# Patient Record
Sex: Female | Born: 1990 | Race: Black or African American | Hispanic: No | Marital: Single | Smoking: Never smoker
Health system: Southern US, Community
[De-identification: ages and names within clinical notes are randomized; demographics above are authoritative.]

## PROBLEM LIST (undated history)

## (undated) DIAGNOSIS — O43129 Velamentous insertion of umbilical cord, unspecified trimester: Secondary | ICD-10-CM

## (undated) DIAGNOSIS — Z789 Other specified health status: Secondary | ICD-10-CM

## (undated) DIAGNOSIS — I10 Essential (primary) hypertension: Secondary | ICD-10-CM

## (undated) HISTORY — DX: Velamentous insertion of umbilical cord, unspecified trimester: O43.129

## (undated) HISTORY — PX: NO PAST SURGERIES: SHX2092

---
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
## 2011-08-24 ENCOUNTER — Emergency Department (INDEPENDENT_AMBULATORY_CARE_PROVIDER_SITE_OTHER)
Admission: EM | Admit: 2011-08-24 | Discharge: 2011-08-24 | Disposition: A | Discharge status: Home / Self Care | Payer: Medicaid Other | Source: Home / Self Care

## 2011-08-24 ENCOUNTER — Encounter (HOSPITAL_COMMUNITY): Payer: Self-pay | Admitting: *Deleted

## 2011-08-24 DIAGNOSIS — J329 Chronic sinusitis, unspecified: Secondary | ICD-10-CM

## 2011-08-24 MED ORDER — NAPROXEN 500 MG PO TABS: 500.0000 mg | ORAL_TABLET | Freq: Two times a day (BID) | ORAL | Status: AC

## 2011-08-24 MED ORDER — BENZONATATE 100 MG PO CAPS: 100.0000 mg | ORAL_CAPSULE | Freq: Three times a day (TID) | ORAL | Status: AC

## 2011-08-24 MED ORDER — FLUTICASONE PROPIONATE 50 MCG/ACT NA SUSP: 2.0000 | Freq: Every day | NASAL | Status: DC

## 2011-08-24 NOTE — ED Provider Notes (Signed)
Medical screening examination/treatment/procedure(s) were performed by non-physician practitioner and as supervising physician I was immediately available for consultation/collaboration.  Corrie Mckusick, MD 08/24/11 2026

## 2011-08-24 NOTE — ED Provider Notes (Signed)
History     CSN: [id number]  Arrival date & time 08/24/11  0916  9:50 AM HPI Sarah Andersen is a 21 y.o. female complaining of a sinus infection. States for 4 days she has symptoms of nasal congestion, rhinorrhea, sinus headache, and a mild cough. Denies fever, neck pain, sore throat, chest pain, shortness of breath, abdominal pain, nausea, vomiting. Reports symptoms feel like prior sinus infections she has had in the past. No improvement after taking ibuprofen.  Patient is a 21 y.o. female presenting with sinusitis. The history is provided by the patient.  Sinusitis  This is a new problem. Episode onset: 4 days ago. The problem has been gradually worsening. There has been no fever. The pain is at a severity of 8/10. The pain is moderate. The pain has been constant since onset. Associated symptoms include congestion, sinus pressure, sore throat and cough (nonproductive). Pertinent negatives include no chills, no ear pain and no shortness of breath. Treatments tried: ibuprofen. The treatment provided no relief.    History reviewed. No pertinent past medical history.  History reviewed. No pertinent past surgical history.  History reviewed. No pertinent family history.  History  Substance Use Topics  . Smoking status: Never Smoker   . Smokeless tobacco: Not on file  . Alcohol Use: No    OB History    Grav Para Term Preterm Abortions TAB SAB Ect Mult Living                  Review of Systems  Constitutional: Negative for fever and chills.  HENT: Positive for congestion, sore throat, rhinorrhea, postnasal drip and sinus pressure. Negative for ear pain and neck pain.   Respiratory: Positive for cough (nonproductive). Negative for shortness of breath.   Cardiovascular: Negative for chest pain.  Gastrointestinal: Negative for nausea, vomiting and abdominal pain.  Skin: Negative for rash.  Neurological: Positive for headaches. Negative for dizziness, light-headedness and numbness.  All  other systems reviewed and are negative.    Allergies  Review of patient's allergies indicates no known allergies.  Home Medications   Current Outpatient Rx  Name Route Sig Dispense Refill  . DIPHENHYDRAMINE HCL 25 MG PO CAPS Oral Take 25 mg by mouth every 6 (six) hours as needed.    . IBUPROFEN 200 MG PO TABS Oral Take 200 mg by mouth every 6 (six) hours as needed.      BP 127/82  Pulse 107  Temp(Src) 98.9 F (37.2 C) (Oral)  Resp 20  SpO2 98%  Physical Exam  Vitals reviewed. Constitutional: She is oriented to person, place, and time. Vital signs are normal. She appears well-developed and well-nourished.  HENT:  Head: Normocephalic and atraumatic.  Right Ear: Tympanic membrane, external ear and ear canal normal.  Left Ear: Tympanic membrane, external ear and ear canal normal.  Nose: Rhinorrhea present. Right sinus exhibits maxillary sinus tenderness and frontal sinus tenderness. Left sinus exhibits maxillary sinus tenderness and frontal sinus tenderness.  Mouth/Throat: Uvula is midline, oropharynx is clear and moist and mucous membranes are normal. No uvula swelling. No oropharyngeal exudate, posterior oropharyngeal erythema or tonsillar abscesses.  Eyes: Conjunctivae are normal. Pupils are equal, round, and reactive to light.  Neck: Normal range of motion. Neck supple.  Cardiovascular: Normal rate, regular rhythm and normal heart sounds.  Exam reveals no friction rub.   No murmur heard. Pulmonary/Chest: Effort normal and breath sounds normal. She has no wheezes. She has no rhonchi. She has no rales. She exhibits no  tenderness.  Musculoskeletal: Normal range of motion.  Lymphadenopathy:    She has no cervical adenopathy.  Neurological: She is alert and oriented to person, place, and time. Coordination normal.  Skin: Skin is warm and dry. No rash noted. No erythema. No pallor.    ED Course  Procedures   MDM          Thomasene Lot, PA-C 08/24/11 4782

## 2011-08-24 NOTE — Discharge Instructions (Signed)
How to clear your sinuses:  Use Afrin (and over-the-counter nasal decongestant spray): 1-2 squirts in each nostril. Wait 10 minutes (while using either warm compresses to face, a humidifier, or taking a hot shower) Then do 2 squirts of fluticasone (prescription)  in each nostril. Do this for 1-2 weeks and that should help significantly with sinus congestion. Take Naproxen for pain and tessalon for cough.  Sinusitis Sinuses are air pockets within the bones of your face. The growth of bacteria within a sinus leads to infection. The infection prevents the sinuses from draining. This infection is called sinusitis. SYMPTOMS  There will be different areas of pain depending on which sinuses have become infected.  The maxillary sinuses often produce pain beneath the eyes.   Frontal sinusitis may cause pain in the middle of the forehead and above the eyes.  Other problems (symptoms) include:  Toothaches.   Colored, pus-like (purulent) drainage from the nose.   Swelling, warmth, and tenderness over the sinus areas may be signs of infection.  TREATMENT  Sinusitis is most often determined by an exam.X-rays may be taken. If x-rays have been taken, make sure you obtain your results or find out how you are to obtain them. Your caregiver may give you medications (antibiotics). These are medications that will help kill the bacteria causing the infection. You may also be given a medication (decongestant) that helps to reduce sinus swelling.  HOME CARE INSTRUCTIONS   Only take over-the-counter or prescription medicines for pain, discomfort, or fever as directed by your caregiver.   Drink extra fluids. Fluids help thin the mucus so your sinuses can drain more easily.   Applying either moist heat or ice packs to the sinus areas may help relieve discomfort.   Use saline nasal sprays to help moisten your sinuses. The sprays can be found at your local drugstore.  SEEK IMMEDIATE MEDICAL CARE IF:  You have a  fever.   You have increasing pain, severe headaches, or toothache.   You have nausea, vomiting, or drowsiness.   You develop unusual swelling around the face or trouble seeing.  MAKE SURE YOU:   Understand these instructions.   Will watch your condition.   Will get help right away if you are not doing well or get worse.  Document Released: 06/23/2005 Document Revised: 03/05/2011 Document Reviewed: 01/20/2007 [facility] Patient Information 2012 [place], [place].

## 2011-08-24 NOTE — ED Notes (Signed)
Pt with c/o cough/congestion/headache onset of symptoms Thursday - denies fever

## 2012-07-07 NOTE — L&D Delivery Note (Addendum)
Delivery Note At 3:42 AM a viable and healthy female was delivered spontaneously OA.  APGAR: 9, 9; weight 5 lbs 8oz.   Placenta status: Intact, Spontaneous.  Cord:  3 vessels, loose, single nuchal loop.  Anesthesia: Epidural  Episiotomy: None Lacerations: 1st degree right labia minora.  Repaired. Suture Repair: 4-0 chromic Est. Blood Loss (mL): 350  Mom and baby stable with skin to skin.  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  Arden Axon D 01/26/2013, 4:04 AM

## 2012-09-06 LAB — OB RESULTS CONSOLE HIV ANTIBODY (ROUTINE TESTING): HIV: NONREACTIVE

## 2012-09-06 LAB — OB RESULTS CONSOLE ABO/RH

## 2012-09-06 LAB — OB RESULTS CONSOLE ANTIBODY SCREEN: Antibody Screen: NEGATIVE

## 2012-09-06 LAB — OB RESULTS CONSOLE GC/CHLAMYDIA: Gonorrhea: NEGATIVE

## 2012-09-29 ENCOUNTER — Other Ambulatory Visit (HOSPITAL_COMMUNITY): Payer: Self-pay | Admitting: Obstetrics & Gynecology

## 2012-09-29 ENCOUNTER — Other Ambulatory Visit: Payer: Self-pay

## 2012-09-29 DIAGNOSIS — O283 Abnormal ultrasonic finding on antenatal screening of mother: Secondary | ICD-10-CM

## 2012-10-05 ENCOUNTER — Ambulatory Visit (HOSPITAL_COMMUNITY): Admission: RE | Admit: 2012-10-05 | Payer: 59 | Source: Ambulatory Visit

## 2012-10-05 ENCOUNTER — Other Ambulatory Visit (HOSPITAL_COMMUNITY): Payer: Self-pay | Admitting: Obstetrics & Gynecology

## 2012-10-05 ENCOUNTER — Encounter (HOSPITAL_COMMUNITY): Payer: Self-pay

## 2012-10-05 ENCOUNTER — Ambulatory Visit (HOSPITAL_COMMUNITY)
Admission: RE | Admit: 2012-10-05 | Discharge: 2012-10-05 | Disposition: A | Discharge status: Home / Self Care | Payer: 59 | Source: Ambulatory Visit | Attending: Obstetrics & Gynecology | Admitting: Obstetrics & Gynecology

## 2012-10-05 VITALS — BP 101/65 | HR 85 | Wt 125.5 lb

## 2012-10-05 DIAGNOSIS — O358XX0 Maternal care for other (suspected) fetal abnormality and damage, not applicable or unspecified: Secondary | ICD-10-CM | POA: Insufficient documentation

## 2012-10-05 DIAGNOSIS — O283 Abnormal ultrasonic finding on antenatal screening of mother: Secondary | ICD-10-CM

## 2012-10-05 DIAGNOSIS — O352XX0 Maternal care for (suspected) hereditary disease in fetus, not applicable or unspecified: Secondary | ICD-10-CM | POA: Insufficient documentation

## 2012-10-05 DIAGNOSIS — IMO0002 Reserved for concepts with insufficient information to code with codable children: Secondary | ICD-10-CM

## 2012-10-05 DIAGNOSIS — Z363 Encounter for antenatal screening for malformations: Secondary | ICD-10-CM | POA: Insufficient documentation

## 2012-10-05 DIAGNOSIS — Z1389 Encounter for screening for other disorder: Secondary | ICD-10-CM | POA: Insufficient documentation

## 2012-10-05 NOTE — Progress Notes (Signed)
[facility] ultrasound  Indication: 22 yr old G1P0 at [redacted]w[redacted]d for fetal anatomic survey; finding of right leg abnormality on outside ultrasound.  Findings: 1. Single intrauterine pregnancy. 2. Fetal biometry is consistent with dating. 3. Anterior placenta without evidence of previa. 4. Normal amniotic fluid volume. 5. Normal transvaginal cervical length. 6. The right lower leg is abnormal. There is a normal femur; the lower leg- there is no evidence of a normal tibia and fibula; there appears to be an appendage coming off of the knee. No normal lower leg or foot is seen. 7. The left leg appears normal; the left foot appears to be slightly misshapen in certain views. 8. The remainder of the anatomic survey is normal.  Recommendations: 1. Appropriate fetal growth. 2. Fetal anatomic survey is complete. 3. Right leg anomaly: - see consult letter - referred to Pediatric Orthopedic Surgery 4. Had normal quad screen - declined amniocentesis 5. Recommend follow up in 4 weeks  Sarah Foster, MD

## 2012-10-05 NOTE — Consult Note (Signed)
MFM consult  22 yr old G1P0 at [redacted]w[redacted]d referred by Dr. Arlyce Dice for fetal anatomic survey and consult secondary to finding of right leg abnormality on outside ultrasound.  Past OB hx: none PMH: none Medications: PNV  Patient denies other medication or teratogen exposure in the first trimester  Ultrasound today shows: fetal biometry is consistent with dating. Anterior placenta without evidence of previa. Normal amniotic fluid volume. Normal transvaginal cervical length. The right lower leg is abnormal. There is a normal femur; the lower leg- there is no evidence of a normal tibia and fibula; there appears to be an appendage coming off of the knee. No normal lower leg or foot is seen. The left leg appears normal; the left foot appears to be slightly misshapen in certain views.  There is an echogenic focus in the left ventricle. The remainder of the anatomic survey is normal.  I counseled the patient as follows: 1. Appropriate fetal growth. 2. Fetal anatomic survey is complete. 3. 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; however other possible etiologies are: amniotic band syndrome, teratogen exposure, genetic condition - discussed overall prognosis is good if this anomaly is not in the setting of a genetic condition - discussed the likely course of treatment would be amputation of the lower appendage and use of prosthetic leg - discussed there are many genetic conditions that have limb anomalies; there are no other findings on the ultrasound (with the exception of the echogenic focus) to suggest a genetic condition; discussed the infant will be examined after delivery and if there are any other physical findings suggestive of a genetic condition a work up will be done at that time - I have referred the patient to Pediatric Orthopedic Surgery for prenatal consultation 4. Had normal quad screen - declined amniocentesis 5. Echogenic  focus in the left ventricle: - discussed in low risk patients this is likely a benign variant - discussed when found in association with other findings the risk of trisomy 21 may be increased; although discussed limb abnormalities are not usually associated with trisomy 63 - patient had quad screen which showed risk of trisomy 21 of <1:5,000; risk of trisomy 18 of <1:5,000 and screen negative for neural tube defects; discussed limitations of screening tests in detecting fetal aneuploidy - offered amniocentesis; patient declined 6. Recommend follow up in 4 weeks  I spent 30 minutes in face to face consultation with the patient in addition to time spent on the ultrasound.  Eulis Foster, MD

## 2012-11-02 ENCOUNTER — Ambulatory Visit (HOSPITAL_COMMUNITY)
Admission: RE | Admit: 2012-11-02 | Discharge: 2012-11-02 | Disposition: A | Discharge status: Home / Self Care | Payer: 59 | Source: Ambulatory Visit | Attending: Obstetrics & Gynecology | Admitting: Obstetrics & Gynecology

## 2012-11-02 VITALS — BP 114/56 | HR 85 | Wt 131.8 lb

## 2012-11-02 DIAGNOSIS — O352XX0 Maternal care for (suspected) hereditary disease in fetus, not applicable or unspecified: Secondary | ICD-10-CM | POA: Insufficient documentation

## 2012-11-02 DIAGNOSIS — IMO0002 Reserved for concepts with insufficient information to code with codable children: Secondary | ICD-10-CM

## 2012-11-02 DIAGNOSIS — O358XX0 Maternal care for other (suspected) fetal abnormality and damage, not applicable or unspecified: Secondary | ICD-10-CM | POA: Insufficient documentation

## 2012-12-10 ENCOUNTER — Encounter: Payer: Self-pay | Admitting: Cardiovascular Disease

## 2012-12-14 ENCOUNTER — Encounter (HOSPITAL_COMMUNITY): Payer: Self-pay

## 2012-12-14 ENCOUNTER — Ambulatory Visit (HOSPITAL_COMMUNITY)
Admission: RE | Admit: 2012-12-14 | Discharge: 2012-12-14 | Disposition: A | Discharge status: Home / Self Care | Payer: 59 | Source: Ambulatory Visit | Attending: Obstetrics & Gynecology | Admitting: Obstetrics & Gynecology

## 2012-12-14 VITALS — BP 113/69 | HR 97 | Wt 132.5 lb

## 2012-12-14 DIAGNOSIS — O358XX0 Maternal care for other (suspected) fetal abnormality and damage, not applicable or unspecified: Secondary | ICD-10-CM | POA: Insufficient documentation

## 2012-12-14 DIAGNOSIS — IMO0002 Reserved for concepts with insufficient information to code with codable children: Secondary | ICD-10-CM

## 2012-12-14 DIAGNOSIS — Z3689 Encounter for other specified antenatal screening: Secondary | ICD-10-CM | POA: Insufficient documentation

## 2012-12-14 NOTE — Progress Notes (Signed)
Sarah Andersen  was seen today for an ultrasound appointment.  See full report in [facility].  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 Ortho evaluation after delivery  Alpha Gula, MD

## 2013-01-12 ENCOUNTER — Ambulatory Visit (HOSPITAL_COMMUNITY)
Admission: RE | Admit: 2013-01-12 | Discharge: 2013-01-12 | Disposition: A | Discharge status: Home / Self Care | Payer: 59 | Source: Ambulatory Visit | Attending: Obstetrics & Gynecology | Admitting: Obstetrics & Gynecology

## 2013-01-12 DIAGNOSIS — O358XX0 Maternal care for other (suspected) fetal abnormality and damage, not applicable or unspecified: Secondary | ICD-10-CM | POA: Insufficient documentation

## 2013-01-12 DIAGNOSIS — Z3689 Encounter for other specified antenatal screening: Secondary | ICD-10-CM | POA: Insufficient documentation

## 2013-01-12 DIAGNOSIS — IMO0002 Reserved for concepts with insufficient information to code with codable children: Secondary | ICD-10-CM

## 2013-01-12 NOTE — Progress Notes (Signed)
[facility]  Indication: 22 yr old G1P0 at [redacted]w[redacted]d with fetal leg abnormality for follow up fetal growth.  Findings: 1. Single intrauterine pregnancy. 2. Estimated featl weight is in the 29th%. 3. Anterior placenta without evidence of previa. 4. Normal amniotic fluid index. 5. Again the right lower leg is abnormal although visualization is suboptimal at this advanced gestatational age. 6. The remainder of the limited anatomic survey is normal.  Recommendations: 1. Appropriate fetal growth. 2.  Right leg anomaly: - previously counseled - has met with Pediatric Orthopedic Surgery 3. Had normal quad screen - declined amniocentesis 4. Recommend inform Pediatrics at delivery - plan is for follow up with Pediatric Orthopedics after delivery 5. Recommend follow up ultrasounds as clinically indicated   Sarah Foster, MD

## 2013-01-25 ENCOUNTER — Inpatient Hospital Stay (HOSPITAL_COMMUNITY)
Admission: AD | Admit: 2013-01-25 | Discharge: 2013-01-28 | DRG: 774 | Disposition: A | Discharge status: Home / Self Care | Payer: 59 | Source: Ambulatory Visit | Attending: Obstetrics & Gynecology | Admitting: Obstetrics & Gynecology

## 2013-01-25 ENCOUNTER — Encounter (HOSPITAL_COMMUNITY): Payer: Self-pay | Admitting: *Deleted

## 2013-01-25 DIAGNOSIS — O1414 Severe pre-eclampsia complicating childbirth: Principal | ICD-10-CM | POA: Diagnosis present

## 2013-01-25 DIAGNOSIS — O358XX0 Maternal care for other (suspected) fetal abnormality and damage, not applicable or unspecified: Secondary | ICD-10-CM | POA: Diagnosis present

## 2013-01-25 HISTORY — DX: Other specified health status: Z78.9

## 2013-01-25 LAB — URINE MICROSCOPIC-ADD ON

## 2013-01-25 LAB — URINALYSIS, ROUTINE W REFLEX MICROSCOPIC
Glucose, UA: NEGATIVE mg/dL
Ketones, ur: NEGATIVE mg/dL
Nitrite: NEGATIVE
Protein, ur: NEGATIVE mg/dL
pH: 6.5 (ref 5.0–8.0)

## 2013-01-25 LAB — OB RESULTS CONSOLE GBS: GBS: NEGATIVE

## 2013-01-25 LAB — CBC
HCT: 34.1 % — ABNORMAL LOW (ref 36.0–46.0)
MCHC: 35.2 g/dL (ref 30.0–36.0)
RDW: 13.1 % (ref 11.5–15.5)
WBC: 8.3 10*3/uL (ref 4.0–10.5)

## 2013-01-25 LAB — COMPREHENSIVE METABOLIC PANEL
ALT: 10 U/L (ref 0–35)
AST: 30 U/L (ref 0–37)
Albumin: 2.8 g/dL — ABNORMAL LOW (ref 3.5–5.2)
Alkaline Phosphatase: 188 U/L — ABNORMAL HIGH (ref 39–117)
BUN: 4 mg/dL — ABNORMAL LOW (ref 6–23)
Chloride: 104 mEq/L (ref 96–112)
Potassium: 3.3 mEq/L — ABNORMAL LOW (ref 3.5–5.1)
Sodium: 137 mEq/L (ref 135–145)
Total Bilirubin: 0.3 mg/dL (ref 0.3–1.2)
Total Protein: 6.5 g/dL (ref 6.0–8.3)

## 2013-01-25 LAB — URIC ACID: Uric Acid, Serum: 5.6 mg/dL (ref 2.4–7.0)

## 2013-01-25 MED ORDER — PHENYLEPHRINE 40 MCG/ML (10ML) SYRINGE FOR IV PUSH (FOR BLOOD PRESSURE SUPPORT)
80.0000 ug | PREFILLED_SYRINGE | INTRAVENOUS | Status: DC | PRN
  Filled 2013-01-25: qty 2

## 2013-01-25 MED ORDER — MAGNESIUM SULFATE 40 G IN LACTATED RINGERS - SIMPLE
2.0000 g/h | INTRAVENOUS | Status: DC
  Administered 2013-01-25: 2 g/h via INTRAVENOUS
  Filled 2013-01-25: qty 500

## 2013-01-25 MED ORDER — LACTATED RINGERS IV SOLN
INTRAVENOUS | Status: DC
  Administered 2013-01-25: 23:00:00 via INTRAVENOUS

## 2013-01-25 MED ORDER — BUTORPHANOL TARTRATE 1 MG/ML IJ SOLN: 1.0000 mg | INTRAMUSCULAR | Status: DC | PRN

## 2013-01-25 MED ORDER — LABETALOL HCL 5 MG/ML IV SOLN
20.0000 mg | INTRAVENOUS | Status: DC | PRN
  Administered 2013-01-25 – 2013-01-26 (×2): 20 mg via INTRAVENOUS
  Filled 2013-01-25 (×2): qty 4

## 2013-01-25 MED ORDER — ONDANSETRON HCL 4 MG/2ML IJ SOLN: 4.0000 mg | Freq: Four times a day (QID) | INTRAMUSCULAR | Status: DC | PRN

## 2013-01-25 MED ORDER — PHENYLEPHRINE 40 MCG/ML (10ML) SYRINGE FOR IV PUSH (FOR BLOOD PRESSURE SUPPORT)
80.0000 ug | PREFILLED_SYRINGE | INTRAVENOUS | Status: DC | PRN
  Filled 2013-01-25: qty 2
  Filled 2013-01-25: qty 5

## 2013-01-25 MED ORDER — DIPHENHYDRAMINE HCL 50 MG/ML IJ SOLN: 12.5000 mg | INTRAMUSCULAR | Status: DC | PRN

## 2013-01-25 MED ORDER — MAGNESIUM SULFATE BOLUS VIA INFUSION
4.0000 g | Freq: Once | INTRAVENOUS | Status: AC
  Administered 2013-01-25: 4 g via INTRAVENOUS
  Filled 2013-01-25: qty 500

## 2013-01-25 MED ORDER — OXYTOCIN BOLUS FROM INFUSION: 500.0000 mL | INTRAVENOUS | Status: DC

## 2013-01-25 MED ORDER — IBUPROFEN 600 MG PO TABS: 600.0000 mg | ORAL_TABLET | Freq: Four times a day (QID) | ORAL | Status: DC | PRN

## 2013-01-25 MED ORDER — OXYTOCIN 40 UNITS IN LACTATED RINGERS INFUSION - SIMPLE MED
62.5000 mL/h | INTRAVENOUS | Status: DC
  Administered 2013-01-26: 62.5 mL/h via INTRAVENOUS

## 2013-01-25 MED ORDER — FENTANYL 2.5 MCG/ML BUPIVACAINE 1/10 % EPIDURAL INFUSION (WH - ANES)
14.0000 mL/h | INTRAMUSCULAR | Status: DC | PRN
  Administered 2013-01-26: 14 mL/h via EPIDURAL
  Filled 2013-01-25: qty 125

## 2013-01-25 MED ORDER — LACTATED RINGERS IV SOLN: 500.0000 mL | INTRAVENOUS | Status: DC | PRN

## 2013-01-25 MED ORDER — FLEET ENEMA 7-19 GM/118ML RE ENEM: 1.0000 | ENEMA | RECTAL | Status: DC | PRN

## 2013-01-25 MED ORDER — ACETAMINOPHEN 325 MG PO TABS: 650.0000 mg | ORAL_TABLET | ORAL | Status: DC | PRN

## 2013-01-25 MED ORDER — LIDOCAINE HCL (PF) 1 % IJ SOLN
30.0000 mL | INTRAMUSCULAR | Status: DC | PRN
  Filled 2013-01-25 (×2): qty 30

## 2013-01-25 MED ORDER — CITRIC ACID-SODIUM CITRATE 334-500 MG/5ML PO SOLN: 30.0000 mL | ORAL | Status: DC | PRN

## 2013-01-25 MED ORDER — OXYCODONE-ACETAMINOPHEN 5-325 MG PO TABS: 1.0000 | ORAL_TABLET | ORAL | Status: DC | PRN

## 2013-01-25 MED ORDER — EPHEDRINE 5 MG/ML INJ
10.0000 mg | INTRAVENOUS | Status: DC | PRN
  Filled 2013-01-25: qty 2

## 2013-01-25 MED ORDER — EPHEDRINE 5 MG/ML INJ
10.0000 mg | INTRAVENOUS | Status: DC | PRN
  Filled 2013-01-25: qty 4
  Filled 2013-01-25: qty 2

## 2013-01-25 MED ORDER — LACTATED RINGERS IV SOLN
500.0000 mL | Freq: Once | INTRAVENOUS | Status: AC
  Administered 2013-01-26: 500 mL via INTRAVENOUS

## 2013-01-25 NOTE — MAU Provider Note (Signed)
Chief Complaint:  Labor Eval  First Provider Initiated Contact with Patient 01/25/13 2148     HPI: Sarah Andersen is a 22 y.o. G1P0000 at [redacted]w[redacted]d who presents to maternity admissions reporting strong contractions since 1700. Had aapoipntment today. States she was dilated 4/70 and had normal BP   Pregnancy Course: Followed by [facility] for RLE anomaly and LVEIF.  Past Medical History: Past Medical History  Diagnosis Date  . Medical history non-contributory     Past obstetric history: OB History   Grav Para Term Preterm Abortions TAB SAB Ect Mult Living   1 0 0 0 0 0 0 0 0 0      # Outc Date GA Lbr Len/2nd Wgt Sex Del Anes PTL Lv   1 CUR               Past Surgical History: Past Surgical History  Procedure Laterality Date  . No past surgeries      Family History: Non-contributory  Social History: History  Substance Use Topics  . Smoking status: Never Smoker   . Smokeless tobacco: Not on file  . Alcohol Use: No    Allergies: No Known Allergies  Meds:  Prescriptions prior to admission  Medication Sig Dispense Refill  . Prenatal Vit-Fe Fumarate-FA (PRENATAL MULTIVITAMIN) TABS Take 1 tablet by mouth daily at 12 noon.      . fluticasone (FLONASE) 50 MCG/ACT nasal spray Place 2 sprays into the nose daily.  16 g  2  . [DISCONTINUED] diphenhydrAMINE (BENADRYL) 25 mg capsule Take 25 mg by mouth every 6 (six) hours as needed.      . [DISCONTINUED] ibuprofen (ADVIL,MOTRIN) 200 MG tablet Take 200 mg by mouth every 6 (six) hours as needed.        ROS: Pos for generalized HA x 2 days, 6/10 (no Hx similar HA's). Neg for vision changes, epigastric pain, leakage of fluid or vaginal bleeding. Good fetal movement.    Physical Exam  Blood pressure 173/109, pulse 65, temperature 98.5 F (36.9 C), temperature source Oral, resp. rate 18, height 5\' 6"  (1.676 m), weight 66.225 kg (146 lb), last menstrual period 05/13/2012. Patient Vitals for the past 24 hrs:  BP Temp Temp src Pulse Resp Height  Weight  01/25/13 2212 161/111 mmHg - - 67 - - -  01/25/13 2204 153/126 mmHg - - 71 - - -  01/25/13 2203 155/108 mmHg - - 79 - - -  01/25/13 2132 159/108 mmHg - - 71 - - -  01/25/13 2126 173/109 mmHg - - - - - -  01/25/13 2111 155/113 mmHg - - - - - -  01/25/13 2108 169/110 mmHg - - - - - -  01/25/13 2107 167/111 mmHg - - - - - -  01/25/13 2106 169/110 mmHg 98.5 F (36.9 C) Oral 65 18 5\' 6"  (1.676 m) 66.225 kg (146 lb)    GENERAL: Well-developed, well-nourished female in mild-moderate distress.  HEENT: normocephalic HEART: normal rate RESP: normal effort ABDOMEN: Soft, non-tender, gravid appropriate for gestational age EXTREMITIES: Nontender, no edema NEURO: alert and oriented. DTR's 2+, no clonus. PELVIC EXAM: NEFG, no blood Dilation: 4 Effacement (%): 90 Station: -2 Vertex Exam by:: Ivonne Andrew CNM  FHT:  Baseline 130 , moderate variability, accelerations present, no decelerations Contractions: q 4-5 mins, mild-moderate   Labs: Results for orders placed during the hospital encounter of 01/25/13 (from the past 24 hour(s))  CBC     Status: Abnormal   Collection Time    01/25/13  9:40 PM      Result Value Range   WBC 8.3  4.0 - 10.5 K/uL   RBC 4.01  3.87 - 5.11 MIL/uL   Hemoglobin 12.0  12.0 - 15.0 g/dL   HCT 64.3 (*) 32.9 - 51.8 %   MCV 85.0  78.0 - 100.0 fL   MCH 29.9  26.0 - 34.0 pg   MCHC 35.2  30.0 - 36.0 g/dL   RDW 84.1  66.0 - 63.0 %   Platelets 181  150 - 400 K/uL  COMPREHENSIVE METABOLIC PANEL     Status: Abnormal   Collection Time    01/25/13  9:40 PM      Result Value Range   Sodium 137  135 - 145 mEq/L   Potassium 3.3 (*) 3.5 - 5.1 mEq/L   Chloride 104  96 - 112 mEq/L   CO2 23  19 - 32 mEq/L   Glucose, Bld 83  70 - 99 mg/dL   BUN 4 (*) 6 - 23 mg/dL   Creatinine, Ser 1.60  0.50 - 1.10 mg/dL   Calcium 9.3  8.4 - 10.9 mg/dL   Total Protein 6.5  6.0 - 8.3 g/dL   Albumin 2.8 (*) 3.5 - 5.2 g/dL   AST 30  0 - 37 U/L   ALT 10  0 - 35 U/L   Alkaline  Phosphatase 188 (*) 39 - 117 U/L   Total Bilirubin 0.3  0.3 - 1.2 mg/dL   GFR calc non Af Amer >90  >90 mL/min   GFR calc Af Amer >90  >90 mL/min  URIC ACID     Status: None   Collection Time    01/25/13  9:40 PM      Result Value Range   Uric Acid, Serum 5.6  2.4 - 7.0 mg/dL  LACTATE DEHYDROGENASE     Status: None   Collection Time    01/25/13  9:40 PM      Result Value Range   LDH 227  94 - 250 U/L  URINALYSIS, ROUTINE W REFLEX MICROSCOPIC     Status: Abnormal   Collection Time    01/25/13 10:00 PM      Result Value Range   Color, Urine YELLOW  YELLOW   APPearance CLEAR  CLEAR   Specific Gravity, Urine <1.005 (*) 1.005 - 1.030   pH 6.5  5.0 - 8.0   Glucose, UA NEGATIVE  NEGATIVE mg/dL   Hgb urine dipstick MODERATE (*) NEGATIVE   Bilirubin Urine NEGATIVE  NEGATIVE   Ketones, ur NEGATIVE  NEGATIVE mg/dL   Protein, ur NEGATIVE  NEGATIVE mg/dL   Urobilinogen, UA 0.2  0.0 - 1.0 mg/dL   Nitrite NEGATIVE  NEGATIVE   Leukocytes, UA LARGE (*) NEGATIVE  URINE MICROSCOPIC-ADD ON     Status: None   Collection Time    01/25/13 10:00 PM      Result Value Range   Squamous Epithelial / LPF RARE  RARE   WBC, UA 3-6  <3 WBC/hpf   RBC / HPF 0-2  <3 RBC/hpf   Bacteria, UA RARE  RARE   Imaging:   MAU Course: Dr. Arlyce Dice notified of elevated BPs, HA, contractions, VE. PIH labs and Magnesium Sulfate and Labetalol IV ordered.  Assessment: Severe Pre-clampsia Prodromal vs early labor  Plan: Admit to L&D per consult w/ Dr. Arlyce Dice.  Dr. Arlyce Dice to assume care of pt.   [place], [place] 01/25/2013 9:48 PM

## 2013-01-25 NOTE — H&P (Signed)
22 y.o. G1P0  Estimated Date of Delivery: 02/17/13 admitted at 36/[redacted] weeks gestation with preeclampsia.  Patient was seen in the office this afternoon with BP 104/64 and negative urine protein.  Presented to MAU this evening for labor check and her BPs have been 170/110 range.  She complains of headache.  She otherwise appears calm and the elevated BPs do not appear to be due to pain related anxiety.  Urine protein and PIH labs are pending.  Prenatal Transfer Tool  Maternal Diabetes: No Genetic Screening: Normal QUAD screen at 16 weeks. Maternal Ultrasounds/Referrals: Abnormal:  Findings:   Other: Absent right leg.  No other anomalies noted. Fetal Ultrasounds or other Referrals:  Referred to Materal Fetal Medicine  Maternal Substance Abuse:  No Significant Maternal Medications:  None Significant Maternal Lab Results: None Other Significant Pregnancy Complications:  Severe Preeclampsia  Afebrile, VSS Heart and Lungs: No active disease Abdomen: soft, gravid, EFW AGA. Cervical exam:  4/90, -2  Impression:  Possible early labor (Cervix is unchanged from office exam) and preeclampsia with severe features.  Plan:  IV Magnesium prophylaxis, Labetalol 20 mg IV now; admit to L&D for delivery and will induce if she is not in labor.

## 2013-01-25 NOTE — MAU Note (Signed)
C/o ucs since about 1700 after her doctor's appointment;

## 2013-01-26 ENCOUNTER — Inpatient Hospital Stay (HOSPITAL_COMMUNITY): Payer: 59 | Admitting: Anesthesiology

## 2013-01-26 ENCOUNTER — Encounter (HOSPITAL_COMMUNITY): Payer: Self-pay | Admitting: Anesthesiology

## 2013-01-26 ENCOUNTER — Encounter (HOSPITAL_COMMUNITY): Payer: Self-pay | Admitting: *Deleted

## 2013-01-26 LAB — CBC
Hemoglobin: 11 g/dL — ABNORMAL LOW (ref 12.0–15.0)
MCHC: 35.1 g/dL (ref 30.0–36.0)
RDW: 13.1 % (ref 11.5–15.5)
WBC: 10.5 10*3/uL (ref 4.0–10.5)

## 2013-01-26 LAB — TYPE AND SCREEN
ABO/RH(D): O POS
Antibody Screen: NEGATIVE

## 2013-01-26 LAB — PROTEIN / CREATININE RATIO, URINE
Creatinine, Urine: 19.52 mg/dL
Total Protein, Urine: 3.2 mg/dL

## 2013-01-26 MED ORDER — LIDOCAINE HCL (PF) 1 % IJ SOLN
INTRAMUSCULAR | Status: DC | PRN
  Administered 2013-01-26 (×4): 4 mL

## 2013-01-26 MED ORDER — TETANUS-DIPHTH-ACELL PERTUSSIS 5-2.5-18.5 LF-MCG/0.5 IM SUSP
0.5000 mL | Freq: Once | INTRAMUSCULAR | Status: AC
  Administered 2013-01-27: 0.5 mL via INTRAMUSCULAR
  Filled 2013-01-26 (×2): qty 0.5

## 2013-01-26 MED ORDER — WITCH HAZEL-GLYCERIN EX PADS: 1.0000 "application " | MEDICATED_PAD | CUTANEOUS | Status: DC | PRN

## 2013-01-26 MED ORDER — OXYTOCIN 40 UNITS IN LACTATED RINGERS INFUSION - SIMPLE MED
1.0000 m[IU]/min | INTRAVENOUS | Status: DC
  Filled 2013-01-26: qty 1000

## 2013-01-26 MED ORDER — ONDANSETRON HCL 4 MG/2ML IJ SOLN: 4.0000 mg | INTRAMUSCULAR | Status: DC | PRN

## 2013-01-26 MED ORDER — LANOLIN HYDROUS EX OINT: TOPICAL_OINTMENT | CUTANEOUS | Status: DC | PRN

## 2013-01-26 MED ORDER — SENNOSIDES-DOCUSATE SODIUM 8.6-50 MG PO TABS: 2.0000 | ORAL_TABLET | Freq: Every day | ORAL | Status: DC

## 2013-01-26 MED ORDER — SENNOSIDES-DOCUSATE SODIUM 8.6-50 MG PO TABS
2.0000 | ORAL_TABLET | Freq: Every day | ORAL | Status: DC
  Administered 2013-01-26 – 2013-01-27 (×2): 2 via ORAL

## 2013-01-26 MED ORDER — SIMETHICONE 80 MG PO CHEW: 80.0000 mg | CHEWABLE_TABLET | ORAL | Status: DC | PRN

## 2013-01-26 MED ORDER — MAGNESIUM SULFATE 40 G IN LACTATED RINGERS - SIMPLE
2.0000 g/h | INTRAVENOUS | Status: DC
  Administered 2013-01-26: 2 g/h via INTRAVENOUS
  Filled 2013-01-26 (×2): qty 500

## 2013-01-26 MED ORDER — TERBUTALINE SULFATE 1 MG/ML IJ SOLN: 0.2500 mg | Freq: Once | INTRAMUSCULAR | Status: DC | PRN

## 2013-01-26 MED ORDER — ZOLPIDEM TARTRATE 5 MG PO TABS: 5.0000 mg | ORAL_TABLET | Freq: Every evening | ORAL | Status: DC | PRN

## 2013-01-26 MED ORDER — OXYCODONE-ACETAMINOPHEN 5-325 MG PO TABS: 1.0000 | ORAL_TABLET | ORAL | Status: DC | PRN

## 2013-01-26 MED ORDER — ONDANSETRON HCL 4 MG PO TABS: 4.0000 mg | ORAL_TABLET | ORAL | Status: DC | PRN

## 2013-01-26 MED ORDER — DIPHENHYDRAMINE HCL 25 MG PO CAPS: 25.0000 mg | ORAL_CAPSULE | Freq: Four times a day (QID) | ORAL | Status: DC | PRN

## 2013-01-26 MED ORDER — PRENATAL MULTIVITAMIN CH: 1.0000 | ORAL_TABLET | Freq: Every day | ORAL | Status: DC

## 2013-01-26 MED ORDER — LACTATED RINGERS IV SOLN
INTRAVENOUS | Status: DC
  Administered 2013-01-26 (×2): via INTRAVENOUS

## 2013-01-26 MED ORDER — DIBUCAINE 1 % RE OINT: 1.0000 "application " | TOPICAL_OINTMENT | RECTAL | Status: DC | PRN

## 2013-01-26 MED ORDER — PRENATAL MULTIVITAMIN CH
1.0000 | ORAL_TABLET | Freq: Every day | ORAL | Status: DC
  Administered 2013-01-26 – 2013-01-28 (×3): 1 via ORAL
  Filled 2013-01-26 (×3): qty 1

## 2013-01-26 MED ORDER — BENZOCAINE-MENTHOL 20-0.5 % EX AERO: 1.0000 "application " | INHALATION_SPRAY | CUTANEOUS | Status: DC | PRN

## 2013-01-26 MED ORDER — LABETALOL HCL 5 MG/ML IV SOLN
20.0000 mg | INTRAVENOUS | Status: DC | PRN
  Administered 2013-01-26: 20 mg via INTRAVENOUS
  Filled 2013-01-26: qty 4

## 2013-01-26 MED ORDER — TETANUS-DIPHTH-ACELL PERTUSSIS 5-2.5-18.5 LF-MCG/0.5 IM SUSP: 0.5000 mL | Freq: Once | INTRAMUSCULAR | Status: DC

## 2013-01-26 MED ORDER — IBUPROFEN 600 MG PO TABS
600.0000 mg | ORAL_TABLET | Freq: Four times a day (QID) | ORAL | Status: DC
  Administered 2013-01-26 – 2013-01-28 (×11): 600 mg via ORAL
  Filled 2013-01-26 (×12): qty 1

## 2013-01-26 MED ORDER — IBUPROFEN 600 MG PO TABS
600.0000 mg | ORAL_TABLET | Freq: Four times a day (QID) | ORAL | Status: DC
Start: 2013-01-26 — End: 2013-01-26

## 2013-01-26 NOTE — Progress Notes (Signed)
UR chart review completed.  

## 2013-01-26 NOTE — Consult Note (Signed)
Called to attend this vaginal delivery at 36 4/[redacted] weeks gestation for maternal precclampsia on MgSO4 and right leg anomaly.  Delivery team arrived in Room 162 before infant was born. Dr. Arlyce Dice excused the team after the delivery since infant was stable.  Neonatologist had no contact with the infant during the entire time.  Overton Mam, MD (Attending Neonatologist)

## 2013-01-26 NOTE — Progress Notes (Signed)
Doing well.  Good urine output.  BPs still elevated but not severe.  Will continue Magnesium for 12-24 hours. Hold antihypertensives for 24 hours and observe for spontaneous resolution of peripartum hypertension.

## 2013-01-26 NOTE — Anesthesia Preprocedure Evaluation (Signed)
Anesthesia Evaluation  Patient identified by MRN, date of birth, ID band Patient awake    Reviewed: Allergy & Precautions, H&P , NPO status , Patient's Chart, lab work & pertinent test results, reviewed documented beta blocker date and time   History of Anesthesia Complications Negative for: history of anesthetic complications  Airway Mallampati: III TM Distance: >3 FB Neck ROM: full    Dental  (+) Teeth Intact   Pulmonary neg pulmonary ROS,  breath sounds clear to auscultation        Cardiovascular hypertension, Rhythm:regular Rate:Normal     Neuro/Psych negative neurological ROS  negative psych ROS   GI/Hepatic negative GI ROS, Neg liver ROS,   Endo/Other  negative endocrine ROS  Renal/GU negative Renal ROS     Musculoskeletal   Abdominal   Peds  Hematology negative hematology ROS (+)   Anesthesia Other Findings   Reproductive/Obstetrics (+) Pregnancy                           Anesthesia Physical Anesthesia Plan  ASA: II  Anesthesia Plan: Epidural   Post-op Pain Management:    Induction:   Airway Management Planned:   Additional Equipment:   Intra-op Plan:   Post-operative Plan:   Informed Consent: I have reviewed the patients History and Physical, chart, labs and discussed the procedure including the risks, benefits and alternatives for the proposed anesthesia with the patient or authorized representative who has indicated his/her understanding and acceptance.     Plan Discussed with:   Anesthesia Plan Comments:         Anesthesia Quick Evaluation

## 2013-01-26 NOTE — Anesthesia Procedure Notes (Signed)
Epidural Patient location during procedure: OB Start time: 01/26/2013 12:28 AM  Staffing Performed by: anesthesiologist   Preanesthetic Checklist Completed: patient identified, site marked, surgical consent, pre-op evaluation, timeout performed, IV checked, risks and benefits discussed and monitors and equipment checked  Epidural Patient position: sitting Prep: site prepped and draped and DuraPrep Patient monitoring: continuous pulse ox and blood pressure Approach: midline Injection technique: LOR air  Needle:  Needle type: Tuohy  Needle gauge: 17 G Needle length: 9 cm and 9 Needle insertion depth: 4.5 cm Catheter type: closed end flexible Catheter size: 19 Gauge Catheter at skin depth: 9.5 cm Test dose: negative  Assessment Events: blood not aspirated, injection not painful, no injection resistance, negative IV test and no paresthesia  Additional Notes Discussed risk of headache, infection, bleeding, nerve injury and failed or incomplete block.  Patient voices understanding and wishes to proceed.  Epidural placed easily on first attempt.  No paresthesia.  Patient tolerated proecdure well with no apparent complications.  Jasmine December, MDReason for block:procedure for pain

## 2013-01-26 NOTE — Anesthesia Postprocedure Evaluation (Signed)
  Anesthesia Post-op Note  Anesthesia Post Note  Patient: Sarah Andersen  Procedure(s) Performed: * No procedures listed *  Anesthesia type: Epidural  Patient location: [facility] AICU  Post pain: Pain level controlled  Post assessment: Post-op Vital signs reviewed  Last Vitals:  Filed Vitals:   01/26/13 0700  BP: 146/94  Pulse: 72  Temp:   Resp:     Post vital signs: Reviewed  Level of consciousness:alert  Complications: No apparent anesthesia complications

## 2013-01-27 LAB — CBC
HCT: 29.2 % — ABNORMAL LOW (ref 36.0–46.0)
Hemoglobin: 10.3 g/dL — ABNORMAL LOW (ref 12.0–15.0)
MCH: 30.1 pg (ref 26.0–34.0)
MCHC: 35.3 g/dL (ref 30.0–36.0)

## 2013-01-27 NOTE — Progress Notes (Addendum)
Patient is eating, ambulating, voiding.  Pain control is good.  No HA, vision change or RUQ pain.  No other complaints.  Filed Vitals:   01/27/13 0400 01/27/13 0500 01/27/13 0600 01/27/13 0700  BP: 135/98 131/99 133/89 152/96  Pulse: 81 87 72 72  Temp: 98 F (36.7 C)     TempSrc: Oral     Resp:      Height:  5\' 6"  (1.676 m)    Weight:  61.689 kg (136 lb)    SpO2: 96% 99% 97% 97%    Fundus firm Perineum without swelling No CT  Lab Results  Component Value Date   WBC 11.1* 01/27/2013   HGB 10.3* 01/27/2013   HCT 29.2* 01/27/2013   MCV 85.4 01/27/2013   PLT 174 01/27/2013    --/--/O POS, O POS (07/22 2140)  A/P Post partum day 1, s/p IOL for severe preeclampsia Will d/c mag as >24hrs. BPs mild range. No symptoms. UOP adequate.  Will transfer to postpartum. Circ in office. Routine care.    Philip Aspen

## 2013-01-28 MED ORDER — IBUPROFEN 200 MG PO TABS: 200.0000 mg | ORAL_TABLET | Freq: Four times a day (QID) | ORAL | Status: DC | PRN

## 2013-01-28 NOTE — Progress Notes (Signed)
CSW spoke with [facility] Early Interventionist after her meeting with MOB.  L. Shoffner states MOB is open to speaking with CSW.  CSW met with MOB to offer support.  She was tearful and states her main concern is that she does not want her son to ever feel "different."  CSW validated her feelings and ensured that she knows it is okay to be sad.  CSW talked about everyone's differences and how mothers want the best for the their children.  CSW discussed signs and symptoms of PPD to watch for, especially given the added stress of the situation.  CSW discussed the possible use of an antidepressant/antianxiety medication if MOB feels she is struggling with emotional concerns for a prolonged period of time.  CSW explained how common and normal PPD is for any pp woman.  CSW encouraged her to call her doctor if she has questions/concerns.  She agreed.  CSW recommends outpatient therapy to help her process her feelings.  MOB is open to this.  CSW provided MOB with a list of counselors in [place] who accept her insurance.  CSW also provided MOB with CSW's contact information and asked her to call if she has questions or concerns. MOB seemed appreciative.  Bonding is evident.

## 2013-01-28 NOTE — Discharge Summary (Signed)
Obstetric Discharge Summary Reason for Admission: induction of labor Prenatal Procedures: Preeclampsia Intrapartum Procedures: spontaneous vaginal delivery Postpartum Procedures: none Complications-Operative and Postpartum: postpartum MgSO4 Hemoglobin  Date Value Range Status  01/27/2013 10.3* 12.0 - 15.0 g/dL Final     HCT  Date Value Range Status  01/27/2013 29.2* 36.0 - 46.0 % Final    Physical Exam:  General: alert and cooperative Lochia: appropriate Uterine Fundus: firm DVT Evaluation: No evidence of DVT seen on physical exam.  Discharge Diagnoses: Term Pregnancy-delivered  Discharge Information: Date: 01/28/2013 Activity: pelvic rest Diet: routine Medications: PNV and Ibuprofen Condition: stable Instructions: refer to practice specific booklet Discharge to: home Follow-up Information   Follow up with Mickel Baas, MD In 1 week.   Contact information:   [address] [place] [address] [phone]       Newborn Data: Live born female  Birth Weight: 5 lb 7.7 oz (2485 g) APGAR: 9, 9  Home with mother.  Philip Aspen 01/28/2013, 9:34 AM

## 2013-01-28 NOTE — Progress Notes (Addendum)
CSW received referral to meet with MOB for "Baby with lower right leg anomoly. Mom teary and won't discuss what is wrong".  CSW has referred this patient to [employer] who can inform MOB of community supports available.  CSW asked that N. Micca notify CSW if she feels MOB needs or wants to speak with a Child psychotherapist as well.  CSW also alerted Heather Carter/Follow up Coordinator RN to make referrals to Early Intervention and [facility].

## 2013-10-16 ENCOUNTER — Encounter (HOSPITAL_COMMUNITY): Payer: Self-pay | Admitting: Emergency Medicine

## 2013-10-16 ENCOUNTER — Emergency Department (HOSPITAL_COMMUNITY)
Admission: EM | Admit: 2013-10-16 | Discharge: 2013-10-17 | Disposition: A | Discharge status: Home / Self Care | Payer: 59 | Attending: Emergency Medicine | Admitting: Emergency Medicine

## 2013-10-16 DIAGNOSIS — M545 Low back pain, unspecified: Secondary | ICD-10-CM

## 2013-10-16 DIAGNOSIS — Z79899 Other long term (current) drug therapy: Secondary | ICD-10-CM | POA: Insufficient documentation

## 2013-10-16 DIAGNOSIS — Y92410 Unspecified street and highway as the place of occurrence of the external cause: Secondary | ICD-10-CM | POA: Insufficient documentation

## 2013-10-16 DIAGNOSIS — Y9389 Activity, other specified: Secondary | ICD-10-CM | POA: Insufficient documentation

## 2013-10-16 DIAGNOSIS — IMO0002 Reserved for concepts with insufficient information to code with codable children: Secondary | ICD-10-CM | POA: Insufficient documentation
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
## 2013-10-17 MED ORDER — METHOCARBAMOL 500 MG PO TABS: 500.0000 mg | ORAL_TABLET | Freq: Two times a day (BID) | ORAL | Status: DC

## 2013-10-17 MED ORDER — NAPROXEN 500 MG PO TABS: 500.0000 mg | ORAL_TABLET | Freq: Two times a day (BID) | ORAL | Status: DC

## 2013-10-17 NOTE — ED Provider Notes (Signed)
Medical screening examination/treatment/procedure(s) were performed by non-physician practitioner and as supervising physician I was immediately available for consultation/collaboration.     Ellisa Devivo, MD 10/17/13 0657 
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
## 2014-05-08 ENCOUNTER — Encounter (HOSPITAL_COMMUNITY): Payer: Self-pay | Admitting: Emergency Medicine

## 2014-05-12 IMAGING — US US OB DETAIL+14 WK
1 series · 12 of 28 positions shown · non-contrast
Comparison: none

OBSTETRICS REPORT
                    (Corrected Final 10/05/2012 [DATE])

Service(s) Provided
 US OB DETAIL + 14 WK                                  76811.0
 US OB TRANSVAGINAL                                    76817.0
Indications
 Detailed fetal anatomic survey
 History of genetic / anatomic abnormality -
 personal or family
Fetal Evaluation
 Num Of Fetuses:    1
 Fetal Heart Rate:  152                          bpm
 Cardiac Activity:  Observed
 Presentation:      Breech
 Placenta:          Anterior, above cervical os
 P. Cord            Visualized
 Insertion:
 Amniotic Fluid
 AFI FV:      Subjectively within normal limits
                                             Larg Pckt:     4.9  cm
Biometry
 BPD:     45.5  mm     G. Age:  19w 5d                CI:         71.4   70 - 86
 OFD:     63.7  mm                                    FL/HC:      17.4   15.9 -
 HC:       175  mm     G. Age:  20w 0d       15  %    HC/AC:      1.18   1.06 -
 AC:       148  mm     G. Age:  20w 1d       24  %    FL/BPD:
 FL:      30.5  mm     G. Age:  19w 3d        8  %    FL/AC:      20.6   20 - 24
 HUM:     33.8  mm     G. Age:  21w 4d       69  %
 CER:     21.2  mm     G. Age:  20w 1d       39  %
 Est. FW:     314  gm    0 lb 11 oz      27  %
Gestational Age
 LMP:           20w 5d        Date:  05/13/12                 EDD:   02/17/13
 U/S Today:     19w 6d                                        EDD:   02/23/13
 Best:          20w 5d     Det. By:  LMP  (05/13/12)          EDD:   02/17/13
Anatomy
 Cranium:          Appears normal         Aortic Arch:      Appears normal
 Fetal Cavum:      Appears normal         Ductal Arch:      Appears normal
 Ventricles:       Appears normal         Diaphragm:        Appears normal
 Choroid Plexus:   Appears normal         Stomach:          Appears normal, left
                                                            sided
 Cerebellum:       Appears normal         Abdomen:          Appears normal
 Posterior Fossa:  Appears normal         Abdominal Wall:   Appears nml (cord
                                                            insert, abd wall)
 Nuchal Fold:      Not applicable (>20    Cord Vessels:     Appears normal (3
                   wks GA)                                  vessel cord)
 Face:             Appears normal         Kidneys:          Appear normal
                   (orbits and profile)
 Lips:             Appears normal         Bladder:          Appears normal
 Palate:           Appears normal         Spine:            Appears normal
 Heart:            Appears normal         Lower             Abnormal, see
                   (4CH, axis, and        Extremities:
                   situs)
                                                            comments
 RVOT:             Appears normal         Upper             Appears normal
                                          Extremities:
 LVOT:             Appears normal
 Other:  Fetus appears to be a male. 5th digit appears normal.
Targeted Anatomy
 Fetal Central Nervous System
 Cisterna Magna:
Cervix Uterus Adnexa
 Cervical Length:    3        cm
 Cervix:       Measured transvaginally.
 Left Ovary:    Not visualized. No adnexal mass visualized.
 Right Ovary:   Not visualized. No adnexal mass visualized.
Impression
INDICATION: 21 yr old G1P0 at 32w7d for fetal anatomic survey;
 finding of right leg abnormality on outside ultrasound.

[Series 1: us ob detail+14 wk · 0.19mm/px · 12 of 126 slices shown]
[im 5/126]
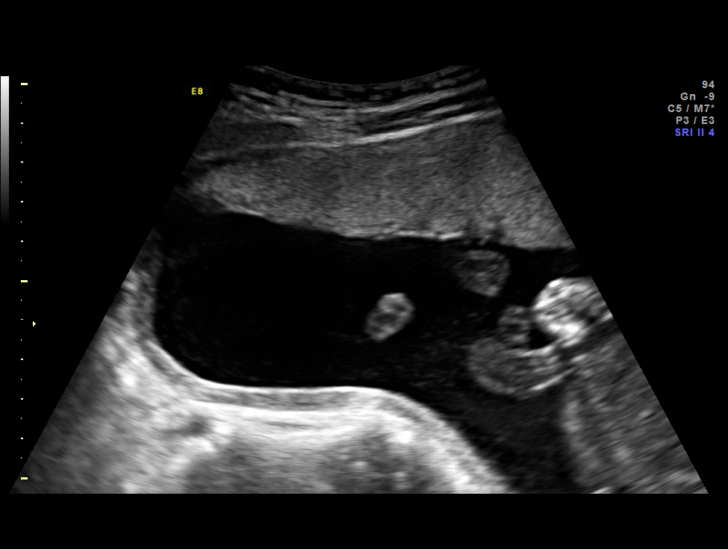
[im 14/126]
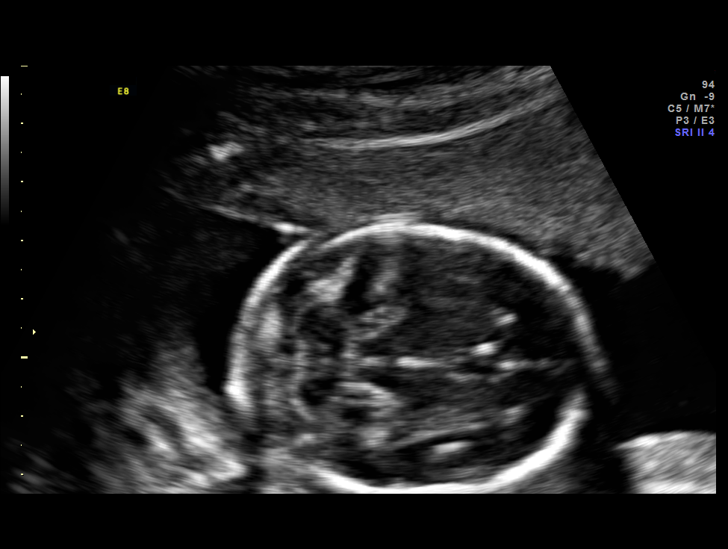
[im 24/126]
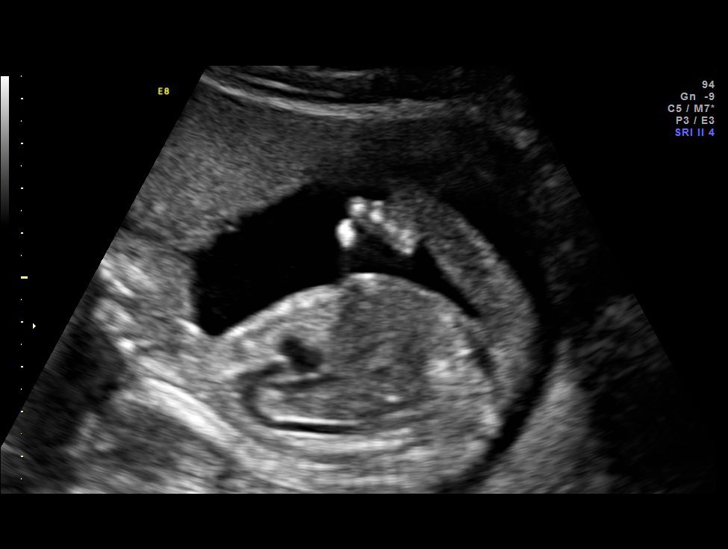
[im 38/126]
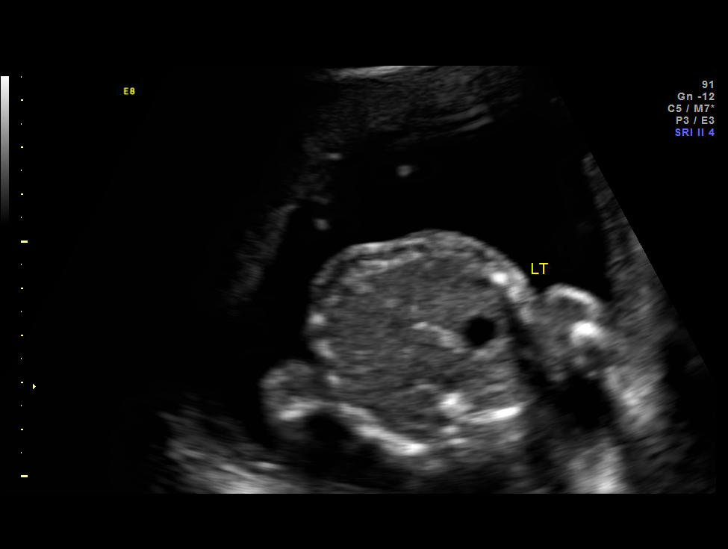
[im 47/126]
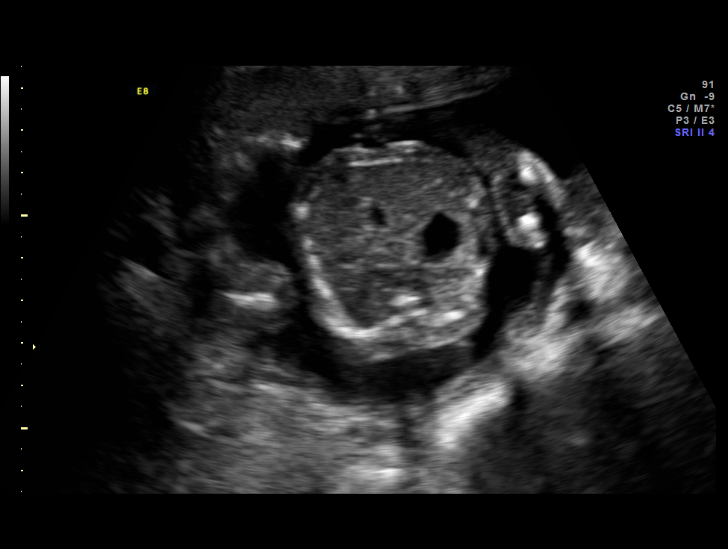
[im 56/126]
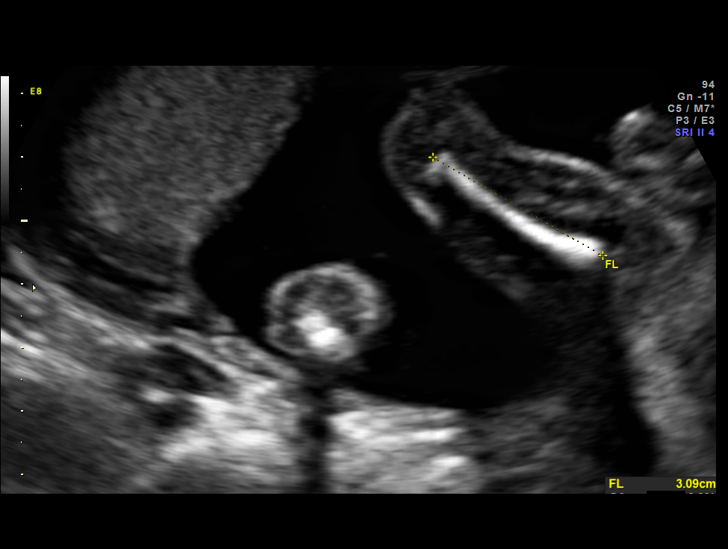
[im 70/126]
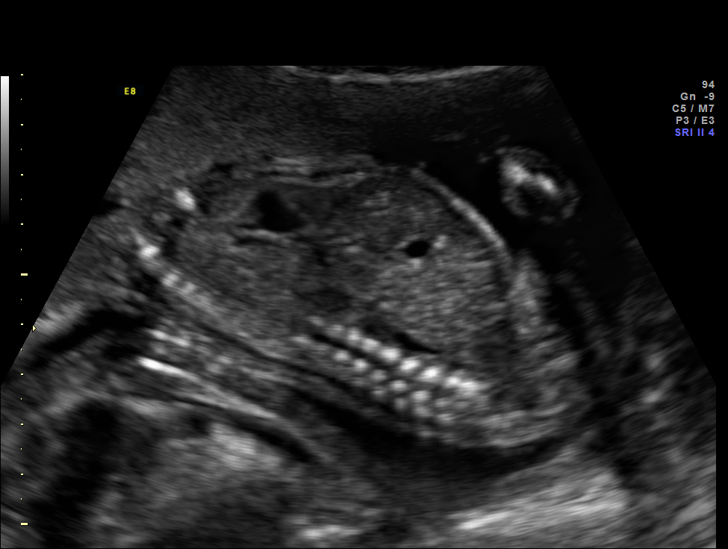
[im 79/126]
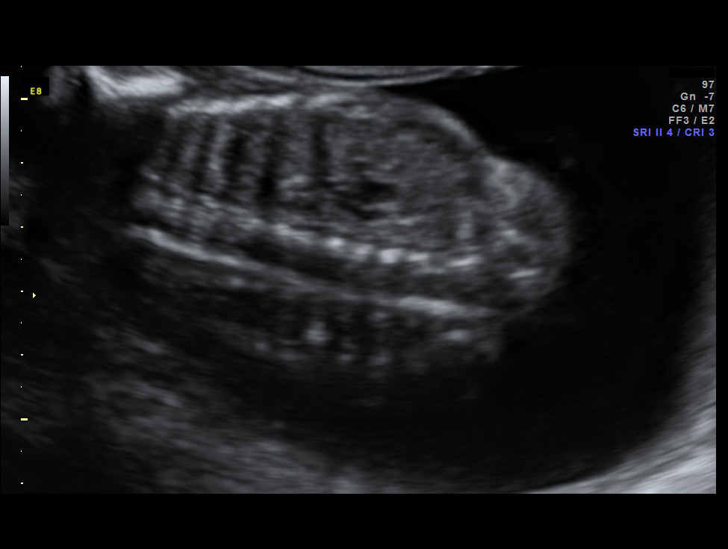
[im 88/126]
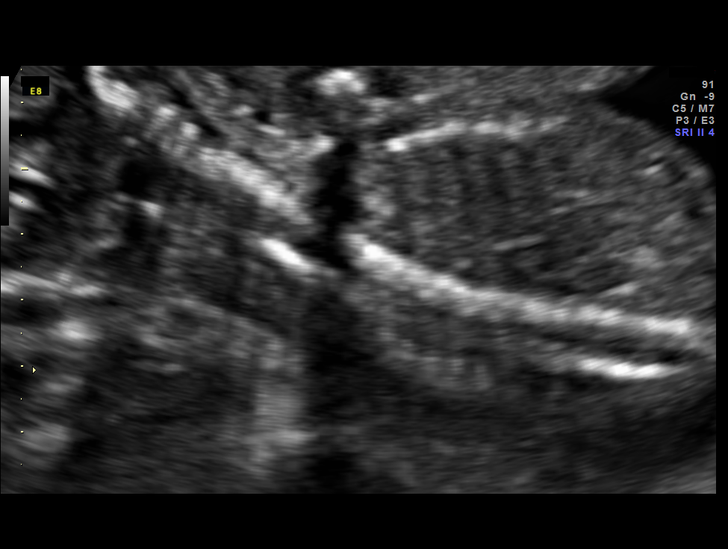
[im 102/126]
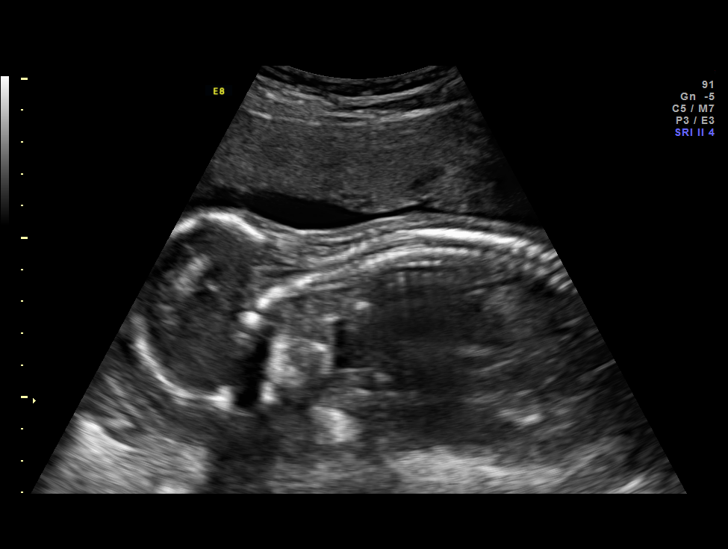
[im 112/126]
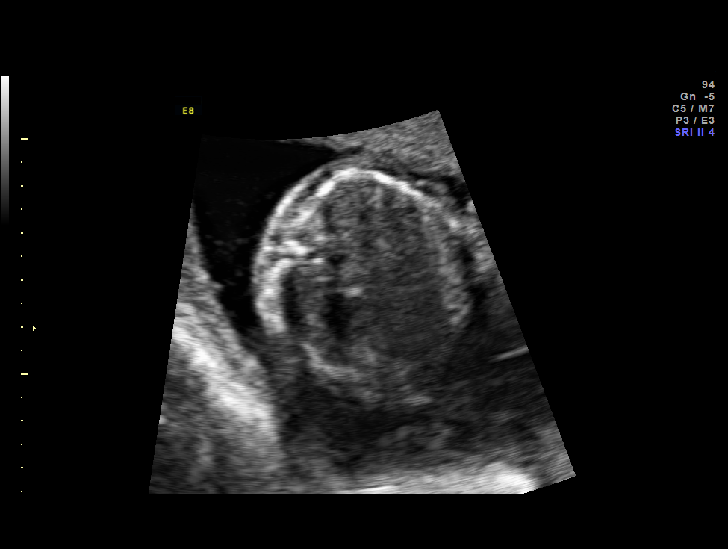
[im 121/126]
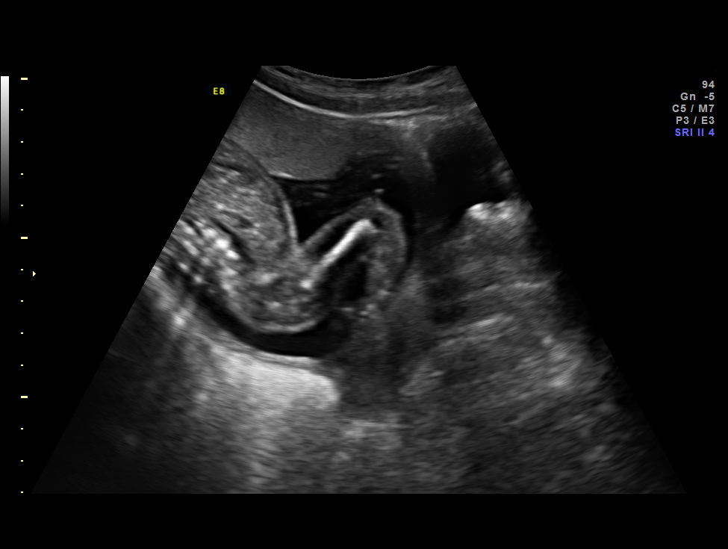

[12 of 28 positions shown; findings below may reference images not displayed]

FINDINGS: 1. Single intrauterine pregnancy.
 2. Fetal biometry is consistent with dating.
 3. Anterior placenta without evidence of previa.
 4. Normal amniotic fluid volume.
 5. Normal transvaginal cervical length.
 6. The right lower leg is abnormal. There is a normal femur;
 the lower leg- there is no evidence of a normal tibia and
 fibula; there appears to be an appendage coming off of the
 knee. No normal lower leg or foot is seen.
 7. The left leg appears normal; the left foot appears to be
 slightly misshapen in certain views.
 8. There is an echogenic focus in the left ventricle.
 9. The remainder of the anatomic survey is normal.
Recommendations

 1. Appropriate fetal growth.
 2. Fetal anatomic survey is complete.
 3. Right leg anomaly:
 - see consult letter
 - referred to Pediatric Orthopedic Surgery
 4. Had normal quad screen
 - declined amniocentesis
 5. Echogenic focus in the left ventricle:
 - see consult letter
 6. Recommend follow up in 4 weeks

 questions or concerns.

## 2014-07-21 IMAGING — US US OB FOLLOW-UP
1 series · 12 of 28 positions shown · non-contrast
Comparison: none

[Series 1: us ob follow-up · 12 of 39 slices shown]
[im 2/39]
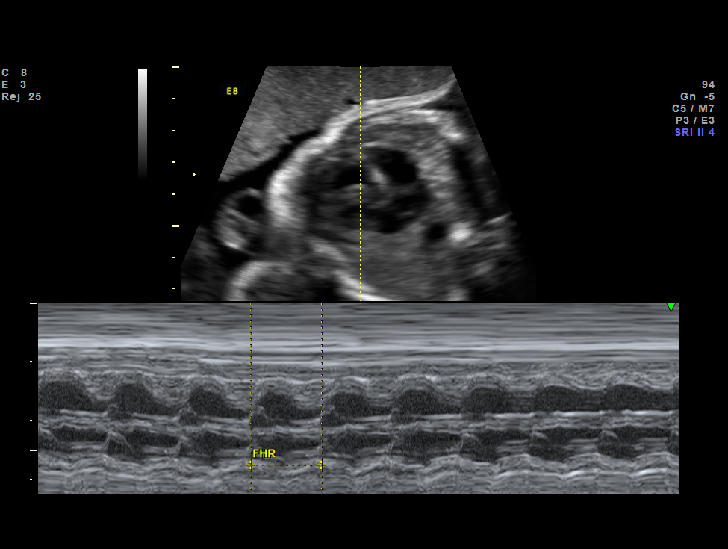
[im 5/39]
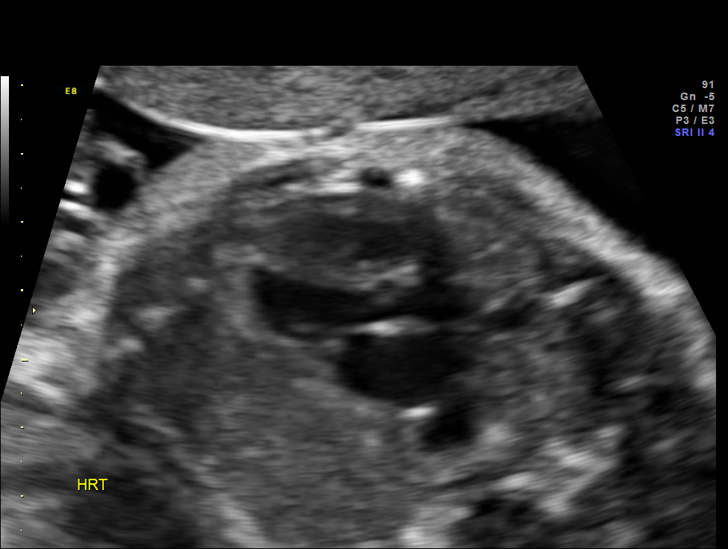
[im 8/39]
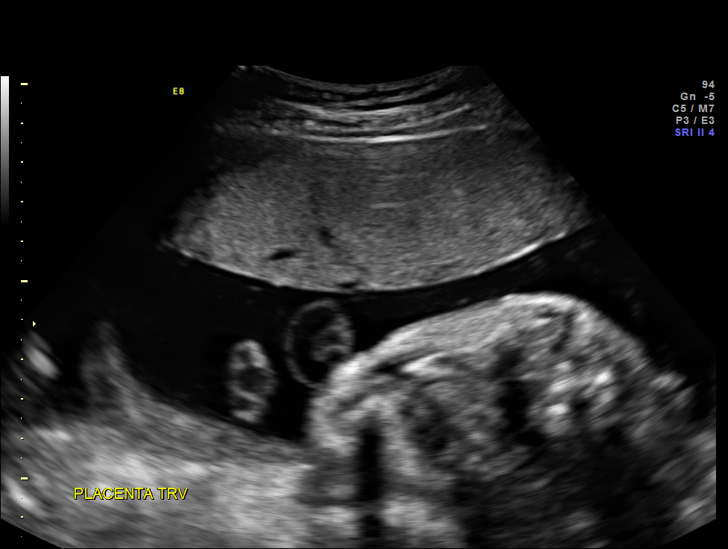
[im 12/39]
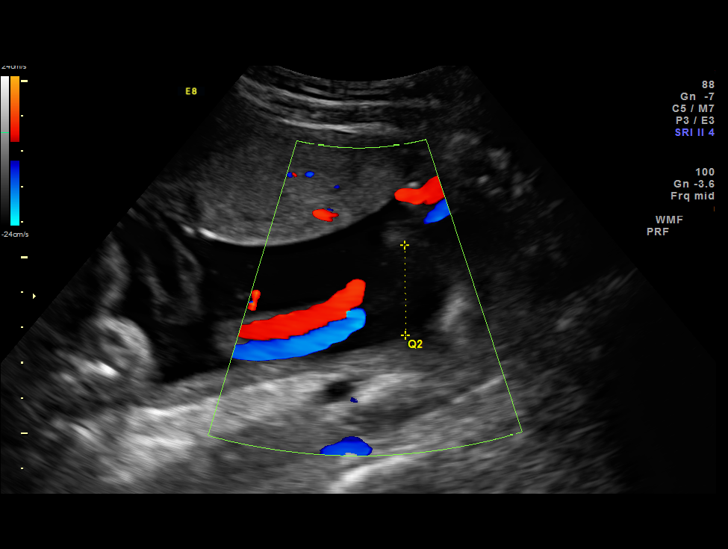
[im 15/39]
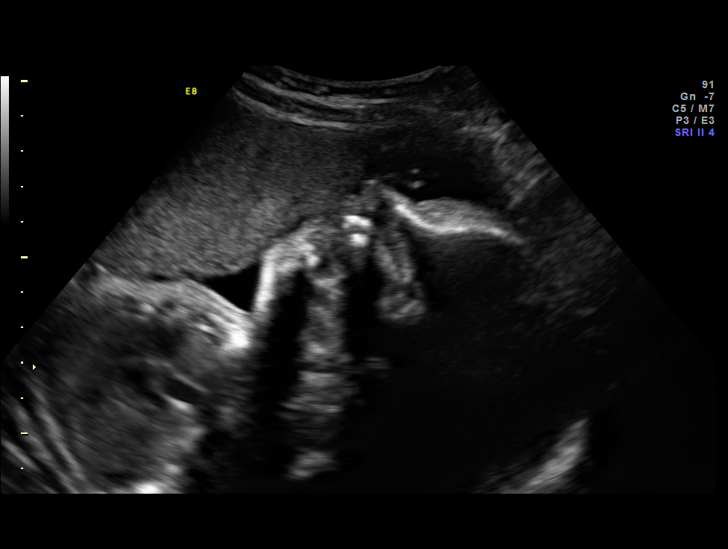
[im 17/39]
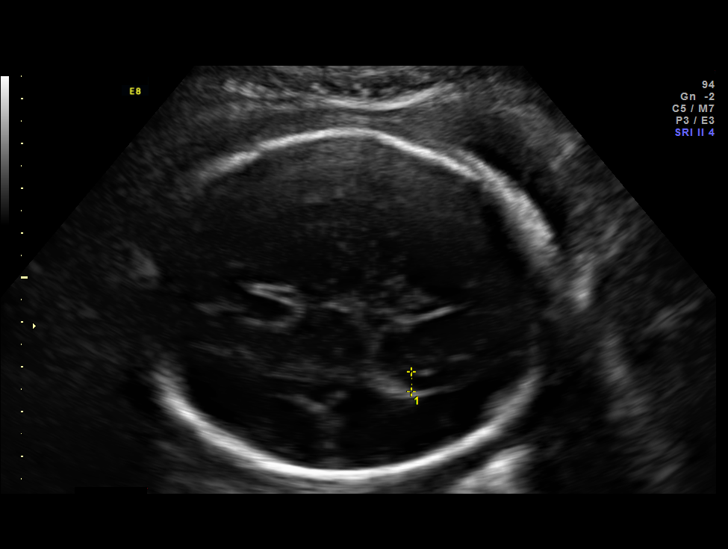
[im 22/39]
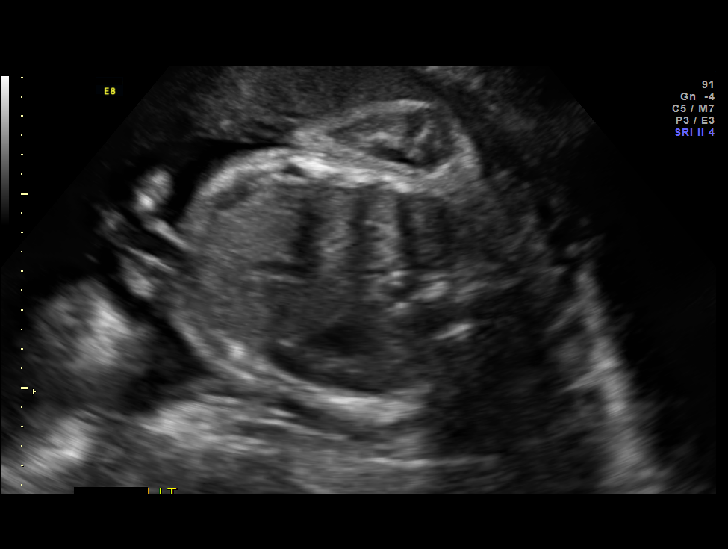
[im 24/39]
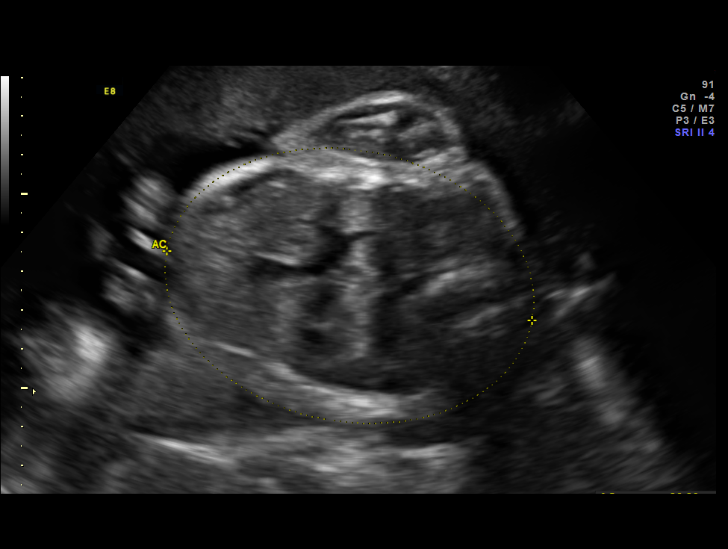
[im 27/39]
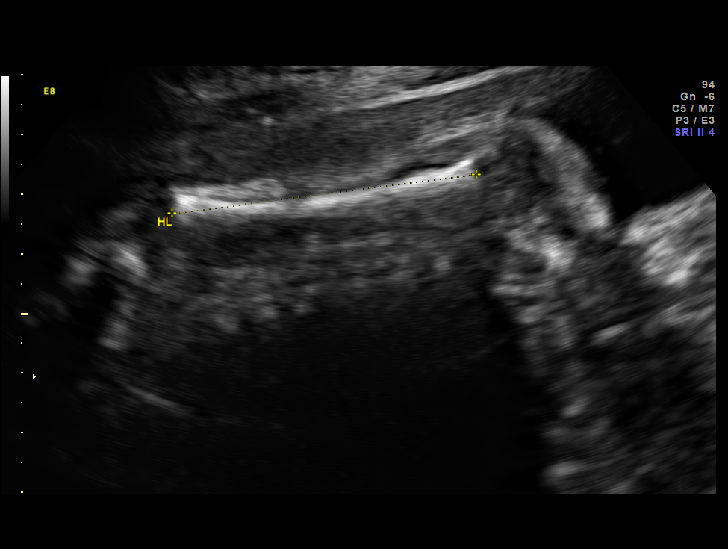
[im 31/39]
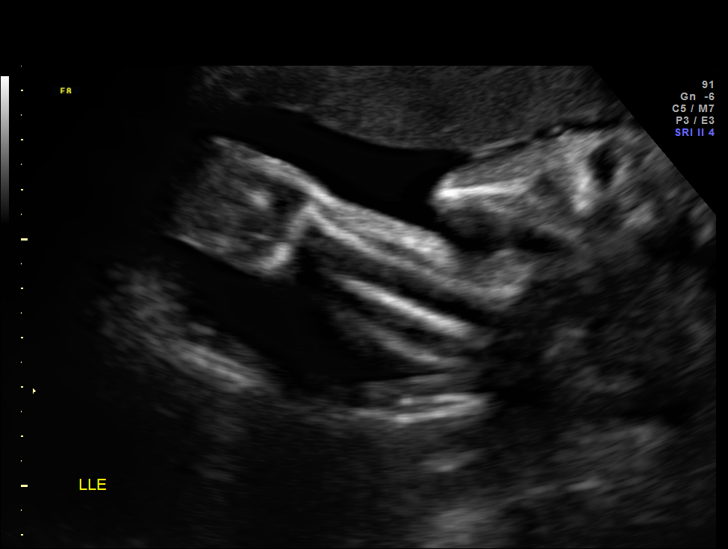
[im 34/39]
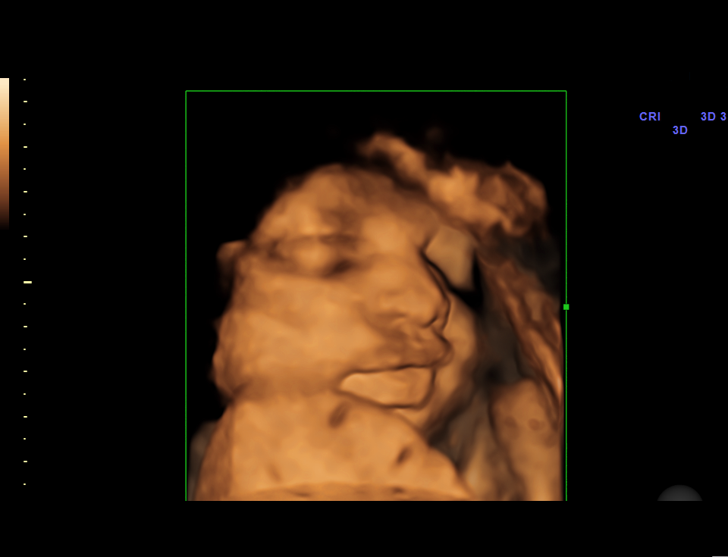
[im 37/39]
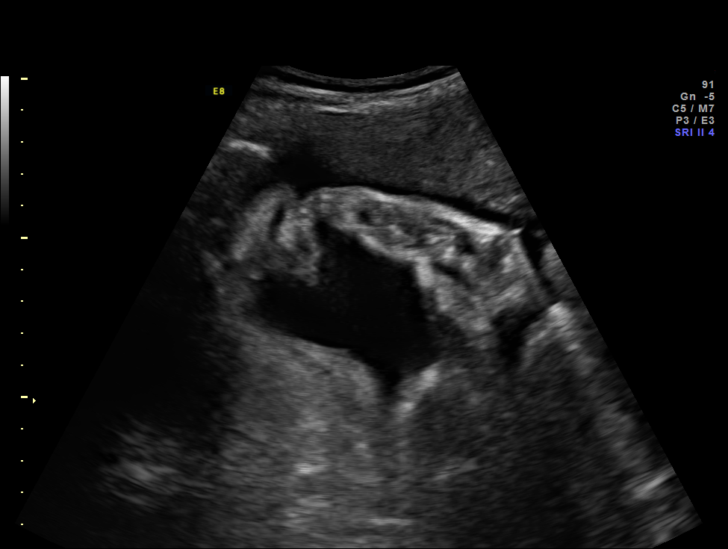

[12 of 28 positions shown; findings below may reference images not displayed]

OBSTETRICS REPORT
                      (Signed Final 12/14/2012 [DATE])

Service(s) Provided

 US OB FOLLOW UP                                       76816.1
Indications

 Fetal abnormality - other known (abnormal right
 lower extremity)
 Echogenic intracardiac focus of the heart  (EIF)
Fetal Evaluation

 Num Of Fetuses:    1
 Fetal Heart Rate:  142                          bpm
 Cardiac Activity:  Observed
 Presentation:      Cephalic
 Placenta:          Anterior, above cervical os
 P. Cord            Previously Visualized
 Insertion:

 Amniotic Fluid
 AFI FV:      Subjectively within normal limits
 AFI Sum:     16.4    cm       60  %Tile     Larg Pckt:    4.82  cm
 RUQ:   4.82    cm   RLQ:    4.29   cm    LUQ:   2.56    cm   LLQ:    4.73   cm
Biometry

 BPD:       76  mm     G. Age:  30w 4d                CI:         79.7   70 - 86
 OFD:     95.4  mm                                    FL/HC:      20.3   19.3 -

 HC:     274.3  mm     G. Age:  30w 0d        6  %    HC/AC:      1.04   0.96 -

 AC:     262.9  mm     G. Age:  30w 3d       38  %    FL/BPD:     73.4   71 - 87
 FL:      55.8  mm     G. Age:  29w 3d        9  %    FL/AC:      21.2   20 - 24
 HUM:     51.2  mm     G. Age:  30w 0d       36  %

 Est. FW:    0260  gm      3 lb 5 oz     41  %
Gestational Age

 LMP:           30w 5d        Date:  05/13/12                 EDD:   02/17/13
 U/S Today:     30w 1d                                        EDD:   02/21/13
 Best:          30w 5d     Det. By:  LMP  (05/13/12)          EDD:   02/17/13
Anatomy
 Cranium:          Appears normal         Aortic Arch:      Previously seen
 Fetal Cavum:      Appears normal         Ductal Arch:      Previously seen
 Ventricles:       Appears normal         Diaphragm:        Appears normal
 Choroid Plexus:   Previously seen        Stomach:          Appears normal, left
                                                            sided
 Cerebellum:       Previously seen        Abdomen:          Previously seen
 Posterior Fossa:  Previously seen        Abdominal Wall:   Previously seen
 Nuchal Fold:      Not applicable (>20    Cord Vessels:     Previously seen
                   wks GA)
 Face:             Orbits and profile     Kidneys:          Previously seen
                   previously seen
 Lips:             Previously seen        Bladder:          Appears normal
 Palate:           Previously seen        Spine:            Previously seen
 Heart:            Appears normal         Lower             Abnormal, see
                   (4CH, axis, and        Extremities:
                   situs)
                                                            comments

 RVOT:             Previously seen        Upper             Previously seen
                                          Extremities:
 LVOT:             Previously seen

 Other:  Male gender. 5th digit previously seen.
Cervix Uterus Adnexa

 Cervix:       Not visualized (advanced GA >44wks)
Impression

 IUP at 30 [DATE] weeks
 Abnormal right lower leg with very hypoplastic tibia and fibula;
 abnormal right foot; normal left lower extremity and foot
 All other interval fetal anatomy was seen and appeared
 normal
 Normal amniotic fluid volume
 Appropriate interval growth with EFW at the 41st %tile

 S/P ortho consultation
Recommendations

 Follow up ultrasound for interval growth in 4 weeks.
 Ortho evaluation after delivery

 questions or concerns.

## 2014-08-19 IMAGING — US US OB FOLLOW-UP
1 series · 12 of 28 positions shown · non-contrast
Comparison: none

OBSTETRICS REPORT
                      (Signed Final 01/12/2013 [DATE])

Service(s) Provided
 US OB FOLLOW UP                                       76816.1
Indications
 Fetal abnormality - other known (abnormal right
 lower extremity)
 Echogenic intracardiac focus of the heart  (EIF)
Fetal Evaluation
 Num Of Fetuses:    1
 Fetal Heart Rate:  148                          bpm
 Cardiac Activity:  Observed
 Presentation:      Cephalic
 Placenta:          Anterior, above cervical os
 P. Cord            Previously Visualized
 Insertion:
 Amniotic Fluid
 AFI FV:      Subjectively within normal limits
 AFI Sum:     13.56   cm       47  %Tile     Larg Pckt:    6.15  cm
 RUQ:   2.97    cm   RLQ:    2.15   cm    LUQ:   2.29    cm   LLQ:    6.15   cm
Biometry
 BPD:     82.8  mm     G. Age:  33w 2d                CI:         74.7   70 - 86
 OFD:    110.9  mm                                    FL/HC:      20.4   20.1 -
 HC:       310  mm     G. Age:  34w 4d       14  %    HC/AC:      1.06   0.93 -
 AC:     292.6  mm     G. Age:  33w 2d       15  %    FL/BPD:     76.4   71 - 87
 FL:      63.3  mm     G. Age:  32w 5d        5  %    FL/AC:      21.6   20 - 24
 HUM:     56.7  mm     G. Age:  33w 0d       26  %
 Est. FW:    1911  gm    4 lb 12 oz      29  %
Gestational Age
 LMP:           34w 6d        Date:  05/13/12                 EDD:   02/17/13
 U/S Today:     33w 3d                                        EDD:   02/27/13
 Best:          34w 6d     Det. By:  LMP  (05/13/12)          EDD:   02/17/13
Anatomy
 Cranium:          Appears normal         Aortic Arch:      Previously seen
 Fetal Cavum:      Previously seen        Ductal Arch:      Previously seen
 Ventricles:       Appears normal         Diaphragm:        Previously seen
 Choroid Plexus:   Previously seen        Stomach:          Appears normal, left
                                                            sided
 Cerebellum:       Previously seen        Abdomen:          Appears normal
 Posterior Fossa:  Previously seen        Abdominal Wall:   Previously seen
 Nuchal Fold:      Not applicable (>20    Cord Vessels:     Previously seen
                   wks GA)
 Face:             Orbits and profile     Kidneys:          Appear normal
                   previously seen
 Lips:             Previously seen        Bladder:          Appears normal
 Palate:           Previously seen        Spine:            Previously seen
 Heart:            Previously seen        Lower             Abnormal, see
                                          Extremities:
                                                            comments
 RVOT:             Previously seen        Upper             Previously seen
 LVOT:             Previously seen
 Other:  Male gender. 5th digit previously seen.  Technically difficult due to
         advanced GA and fetal position.
Cervix Uterus Adnexa
 Cervix:       Not visualized (advanced GA >68wks)
 Uterus:       No abnormality visualized.
 Left Ovary:    Not visualized.
 Right Ovary:   Not visualized.
 Adnexa:     No abnormality visualized.
Impression
INDICATION: 21 yr old G1P0 at 97w1d with fetal leg abnormality
 for follow up fetal growth.

[Series 1: us ob follow-up · 0.21mm/px · 45 acquisitions, 12 frames shown]
[im 2/45]
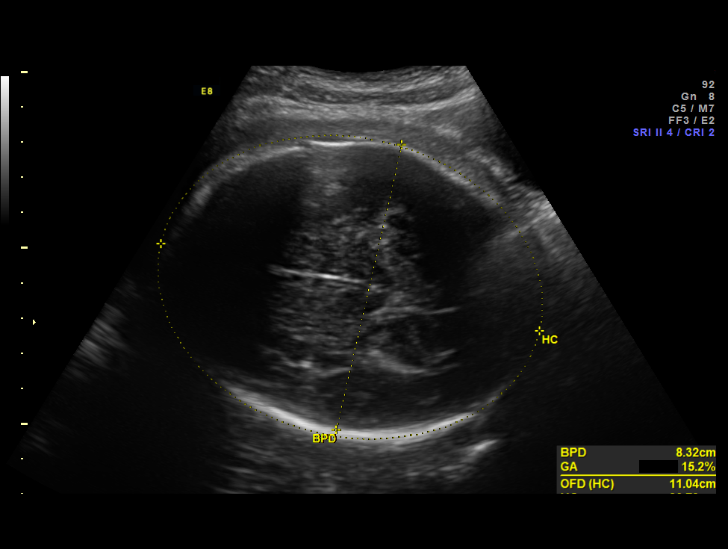
[im 5/45]
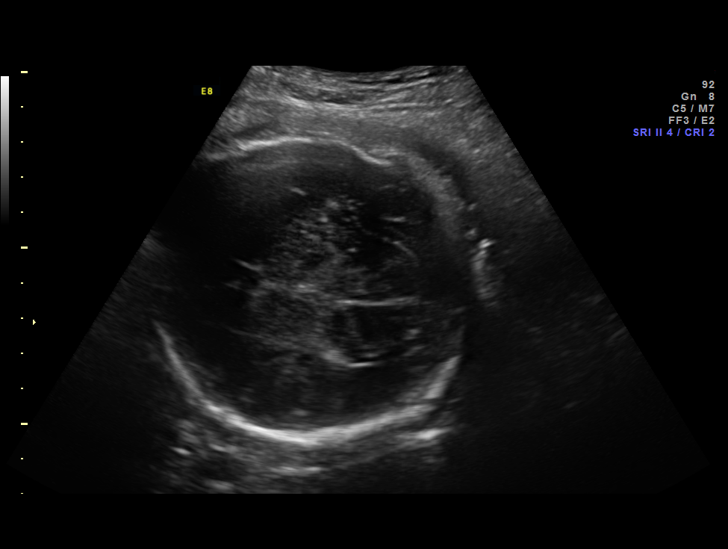
[im 9/45]
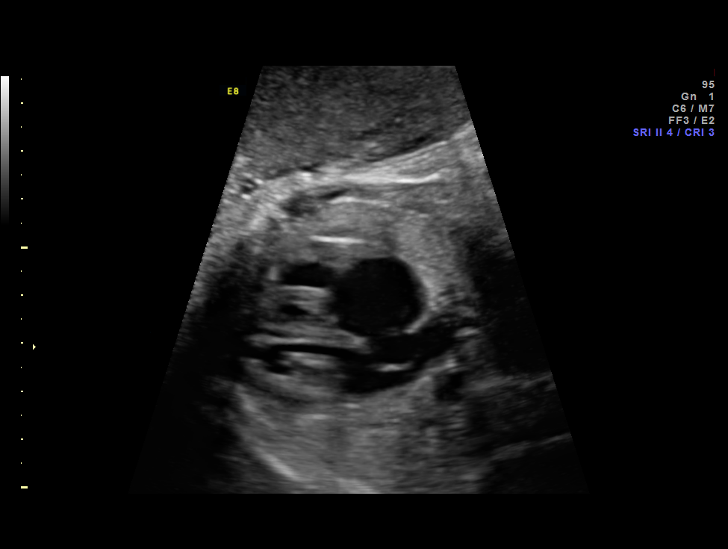
[im 14/45]
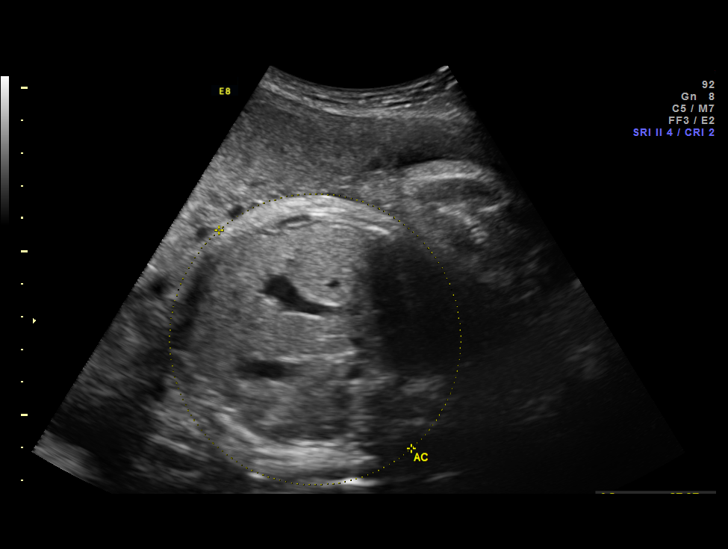
[im 17/45]
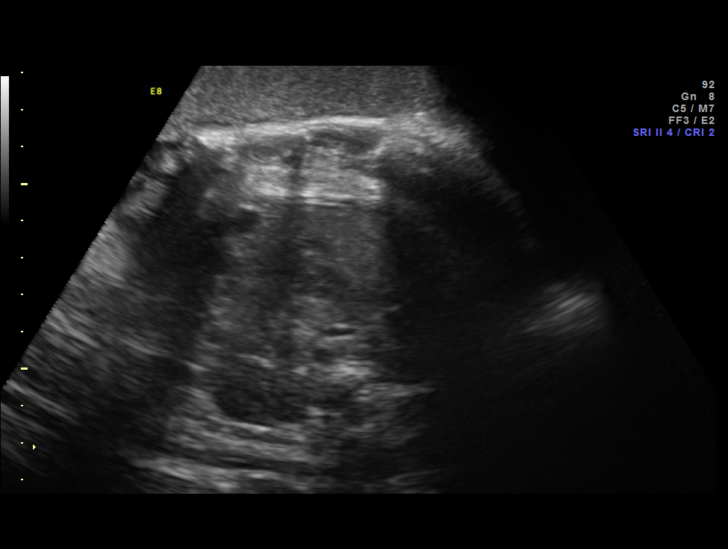
[im 20/45]
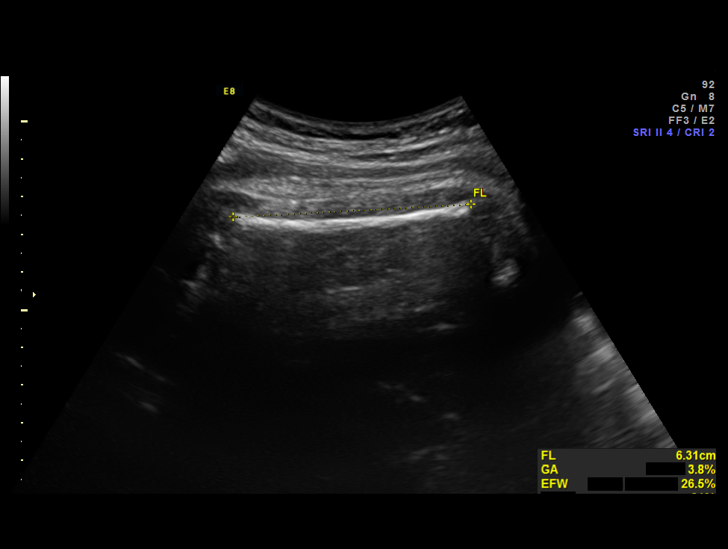
[im 25/45]
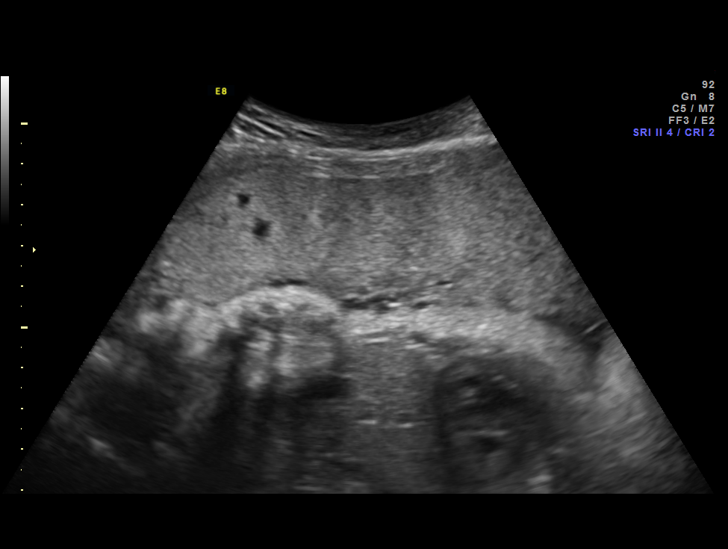
[im 28/45]
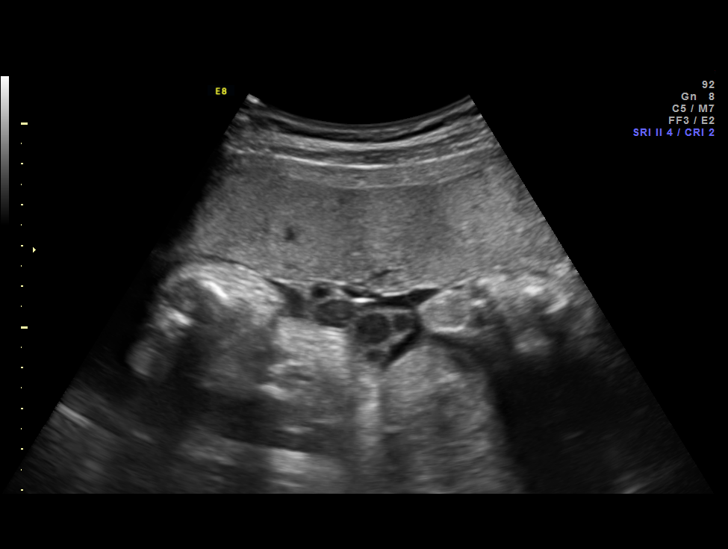
[im 31/45]
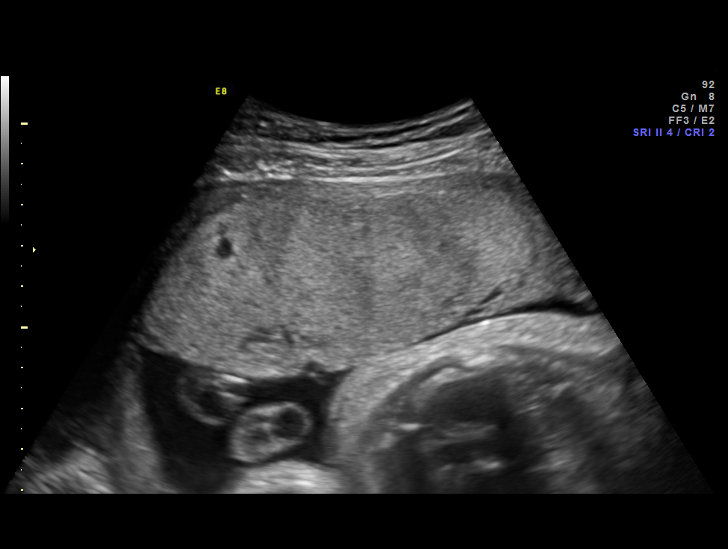
[im 36/45]
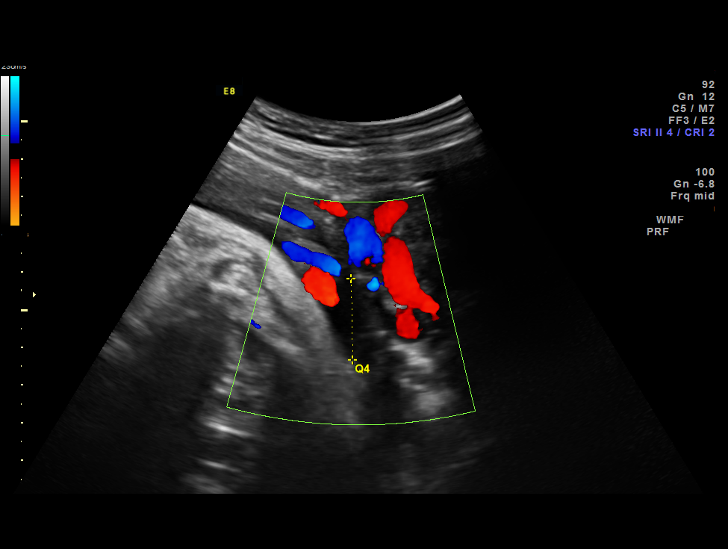
[im 40/45]
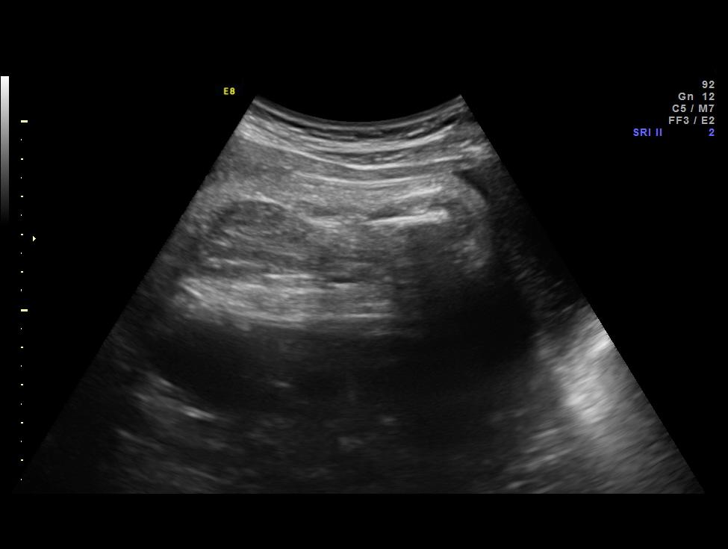
[im 43/45]
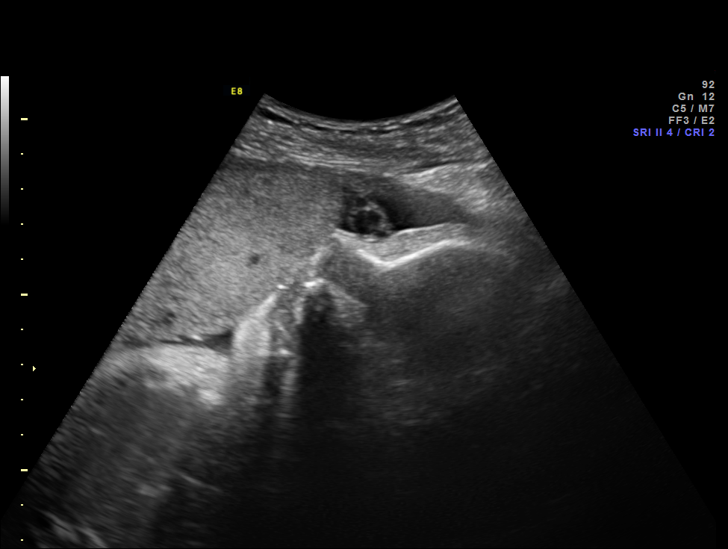

[12 of 28 positions shown; findings below may reference images not displayed]

FINDINGS: 1. Single intrauterine pregnancy.
 2. Estimated featl weight is in the 29th%.
 3. Anterior placenta without evidence of previa.
 4. Normal amniotic fluid index.
 5. Again the right lower leg is abnormal although visualization
 is suboptimal at this advanced gestatational age.
 6. The remainder of the limited anatomic survey is normal.
Recommendations

 1. Appropriate fetal growth.
 2.  Right leg anomaly:
 - previously counseled
 - has met with Pediatric Orthopedic Surgery
 3. Had normal quad screen
 - declined amniocentesis
 4. Recommend inform Pediatrics at delivery
 - plan is for follow up with Pediatric Orthopedics after delivery
 5. Recommend follow up ultrasounds as clinically indicated

 questions or concerns.

## 2014-10-18 ENCOUNTER — Telehealth: Payer: Self-pay | Admitting: Cardiology

## 2014-10-18 NOTE — Telephone Encounter (Signed)
Did not need this encounter,disconnected.

## 2017-10-07 LAB — OB RESULTS CONSOLE RPR: RPR: NONREACTIVE

## 2017-10-07 LAB — OB RESULTS CONSOLE GC/CHLAMYDIA
Chlamydia: NEGATIVE
Gonorrhea: NEGATIVE

## 2017-10-07 LAB — OB RESULTS CONSOLE HEPATITIS B SURFACE ANTIGEN: HEP B S AG: NEGATIVE

## 2017-10-07 LAB — OB RESULTS CONSOLE ABO/RH: RH TYPE: POSITIVE

## 2017-10-07 LAB — OB RESULTS CONSOLE HIV ANTIBODY (ROUTINE TESTING): HIV: NONREACTIVE

## 2017-10-07 LAB — OB RESULTS CONSOLE ANTIBODY SCREEN: ANTIBODY SCREEN: NEGATIVE

## 2017-10-07 LAB — OB RESULTS CONSOLE RUBELLA ANTIBODY, IGM: Rubella: IMMUNE

## 2017-12-07 ENCOUNTER — Other Ambulatory Visit (HOSPITAL_COMMUNITY): Payer: Self-pay | Admitting: Obstetrics and Gynecology

## 2017-12-07 DIAGNOSIS — Z3689 Encounter for other specified antenatal screening: Secondary | ICD-10-CM

## 2017-12-07 DIAGNOSIS — Z3A21 21 weeks gestation of pregnancy: Secondary | ICD-10-CM

## 2017-12-07 DIAGNOSIS — O43122 Velamentous insertion of umbilical cord, second trimester: Secondary | ICD-10-CM

## 2017-12-24 ENCOUNTER — Encounter (HOSPITAL_COMMUNITY): Payer: Self-pay | Admitting: *Deleted

## 2017-12-25 ENCOUNTER — Other Ambulatory Visit: Payer: Self-pay

## 2017-12-25 ENCOUNTER — Encounter (HOSPITAL_COMMUNITY): Payer: Self-pay

## 2017-12-25 ENCOUNTER — Other Ambulatory Visit (HOSPITAL_COMMUNITY): Payer: Self-pay | Admitting: Obstetrics and Gynecology

## 2017-12-25 ENCOUNTER — Ambulatory Visit (HOSPITAL_COMMUNITY)
Admission: RE | Admit: 2017-12-25 | Discharge: 2017-12-25 | Disposition: A | Discharge status: Home / Self Care | Payer: BLUE CROSS/BLUE SHIELD | Source: Ambulatory Visit | Attending: Obstetrics and Gynecology | Admitting: Obstetrics and Gynecology

## 2017-12-25 DIAGNOSIS — Z3689 Encounter for other specified antenatal screening: Secondary | ICD-10-CM

## 2017-12-25 DIAGNOSIS — Z3A21 21 weeks gestation of pregnancy: Secondary | ICD-10-CM

## 2017-12-25 DIAGNOSIS — O352XX0 Maternal care for (suspected) hereditary disease in fetus, not applicable or unspecified: Secondary | ICD-10-CM | POA: Diagnosis not present

## 2017-12-25 DIAGNOSIS — O43122 Velamentous insertion of umbilical cord, second trimester: Secondary | ICD-10-CM

## 2017-12-25 HISTORY — DX: Essential (primary) hypertension: I10

## 2018-03-18 LAB — OB RESULTS CONSOLE GBS: GBS: NEGATIVE

## 2018-03-25 ENCOUNTER — Telehealth (HOSPITAL_COMMUNITY): Payer: Self-pay | Admitting: *Deleted

## 2018-03-25 ENCOUNTER — Encounter (HOSPITAL_COMMUNITY): Payer: Self-pay | Admitting: *Deleted

## 2018-03-25 NOTE — Telephone Encounter (Signed)
Preadmission screen  

## 2018-03-29 ENCOUNTER — Inpatient Hospital Stay (HOSPITAL_COMMUNITY): Payer: BLUE CROSS/BLUE SHIELD

## 2018-03-31 ENCOUNTER — Inpatient Hospital Stay (HOSPITAL_COMMUNITY): Admission: RE | Admit: 2018-03-31 | Payer: BLUE CROSS/BLUE SHIELD | Source: Ambulatory Visit

## 2018-04-15 ENCOUNTER — Other Ambulatory Visit: Payer: Self-pay | Admitting: Obstetrics and Gynecology

## 2018-04-19 ENCOUNTER — Encounter (HOSPITAL_COMMUNITY): Payer: Self-pay

## 2018-04-20 NOTE — Patient Instructions (Signed)
Dalisa Guimond  04/20/2018   Your procedure is scheduled on:  04/23/2018  Enter through the Main Entrance of [facility] at 1030 AM.  Pick up the phone at the desk and dial [phone]  Call this number if you have problems the morning of surgery:[phone]  Remember:   Do not eat food:(After Midnight) Desps de medianoche.  Do not drink clear liquids: (After Midnight) Desps de medianoche.  Take these medicines the morning of surgery with A SIP OF WATER: none   Do not wear jewelry, make-up or nail polish.  Do not wear lotions, powders, or perfumes. Do not wear deodorant.  Do not shave 48 hours prior to surgery.  Do not bring valuables to the hospital.  [facility] is not   responsible for any belongings or valuables brought to the hospital.  Contacts, dentures or bridgework may not be worn into surgery.  Leave suitcase in the car. After surgery it may be brought to your room.  For patients admitted to the hospital, checkout time is 11:00 AM the day of              discharge.    N/A   Please read over the following fact sheets that you were given:   Surgical Site Infection Prevention

## 2018-04-22 ENCOUNTER — Encounter (HOSPITAL_COMMUNITY)
Admission: RE | Admit: 2018-04-22 | Discharge: 2018-04-22 | Disposition: A | Discharge status: Home / Self Care | Payer: BLUE CROSS/BLUE SHIELD | Source: Ambulatory Visit | Attending: Obstetrics and Gynecology | Admitting: Obstetrics and Gynecology

## 2018-04-22 LAB — CBC
HEMATOCRIT: 37.1 % (ref 36.0–46.0)
HEMOGLOBIN: 12.6 g/dL (ref 12.0–15.0)
MCH: 30.5 pg (ref 26.0–34.0)
MCHC: 34 g/dL (ref 30.0–36.0)
MCV: 89.8 fL (ref 80.0–100.0)
Platelets: 187 10*3/uL (ref 150–400)
RBC: 4.13 MIL/uL (ref 3.87–5.11)
RDW: 13.7 % (ref 11.5–15.5)
WBC: 7.8 10*3/uL (ref 4.0–10.5)
nRBC: 0 % (ref 0.0–0.2)

## 2018-04-22 LAB — TYPE AND SCREEN
ABO/RH(D): O POS
ANTIBODY SCREEN: NEGATIVE

## 2018-04-23 ENCOUNTER — Encounter (HOSPITAL_COMMUNITY): Payer: Self-pay | Admitting: *Deleted

## 2018-04-23 ENCOUNTER — Inpatient Hospital Stay (HOSPITAL_COMMUNITY): Payer: BLUE CROSS/BLUE SHIELD | Admitting: Anesthesiology

## 2018-04-23 ENCOUNTER — Other Ambulatory Visit: Payer: Self-pay

## 2018-04-23 ENCOUNTER — Encounter (HOSPITAL_COMMUNITY)
Admission: AD | Disposition: A | Discharge status: Home / Self Care | Payer: Self-pay | Source: Home / Self Care | Attending: Obstetrics and Gynecology

## 2018-04-23 ENCOUNTER — Inpatient Hospital Stay (HOSPITAL_COMMUNITY)
Admission: AD | Admit: 2018-04-23 | Discharge: 2018-04-25 | DRG: 788 | Disposition: A | Discharge status: Home / Self Care | Payer: BLUE CROSS/BLUE SHIELD | Attending: Obstetrics and Gynecology | Admitting: Obstetrics and Gynecology

## 2018-04-23 DIAGNOSIS — Z3A40 40 weeks gestation of pregnancy: Secondary | ICD-10-CM | POA: Diagnosis not present

## 2018-04-23 DIAGNOSIS — O43123 Velamentous insertion of umbilical cord, third trimester: Secondary | ICD-10-CM | POA: Diagnosis present

## 2018-04-23 DIAGNOSIS — O321XX0 Maternal care for breech presentation, not applicable or unspecified: Secondary | ICD-10-CM | POA: Diagnosis present

## 2018-04-23 LAB — RPR: RPR Ser Ql: NONREACTIVE

## 2018-04-23 SURGERY — Surgical Case
Anesthesia: Spinal | Site: Abdomen | Wound class: Clean Contaminated

## 2018-04-23 MED ORDER — ONDANSETRON HCL 4 MG/2ML IJ SOLN
INTRAMUSCULAR | Status: DC | PRN
  Administered 2018-04-23: 4 mg via INTRAVENOUS

## 2018-04-23 MED ORDER — SENNOSIDES-DOCUSATE SODIUM 8.6-50 MG PO TABS
2.0000 | ORAL_TABLET | ORAL | Status: DC
  Administered 2018-04-24 – 2018-04-25 (×2): 2 via ORAL
  Filled 2018-04-23 (×2): qty 2

## 2018-04-23 MED ORDER — ZOLPIDEM TARTRATE 5 MG PO TABS: 5.0000 mg | ORAL_TABLET | Freq: Every evening | ORAL | Status: DC | PRN

## 2018-04-23 MED ORDER — ONDANSETRON HCL 4 MG/2ML IJ SOLN: 4.0000 mg | Freq: Three times a day (TID) | INTRAMUSCULAR | Status: DC | PRN

## 2018-04-23 MED ORDER — SIMETHICONE 80 MG PO CHEW
80.0000 mg | CHEWABLE_TABLET | Freq: Three times a day (TID) | ORAL | Status: DC
  Administered 2018-04-23 – 2018-04-25 (×5): 80 mg via ORAL
  Filled 2018-04-23 (×5): qty 1

## 2018-04-23 MED ORDER — KETOROLAC TROMETHAMINE 30 MG/ML IJ SOLN: 30.0000 mg | Freq: Four times a day (QID) | INTRAMUSCULAR | Status: AC | PRN

## 2018-04-23 MED ORDER — DIBUCAINE 1 % RE OINT: 1.0000 "application " | TOPICAL_OINTMENT | RECTAL | Status: DC | PRN

## 2018-04-23 MED ORDER — DIPHENHYDRAMINE HCL 50 MG/ML IJ SOLN: 12.5000 mg | INTRAMUSCULAR | Status: DC | PRN

## 2018-04-23 MED ORDER — NALBUPHINE HCL 10 MG/ML IJ SOLN: 5.0000 mg | Freq: Once | INTRAMUSCULAR | Status: DC | PRN

## 2018-04-23 MED ORDER — OXYCODONE-ACETAMINOPHEN 5-325 MG PO TABS: 1.0000 | ORAL_TABLET | ORAL | Status: DC | PRN

## 2018-04-23 MED ORDER — FENTANYL CITRATE (PF) 100 MCG/2ML IJ SOLN
INTRAMUSCULAR | Status: DC | PRN
  Administered 2018-04-23: 15 ug via INTRATHECAL

## 2018-04-23 MED ORDER — ONDANSETRON HCL 4 MG/2ML IJ SOLN
INTRAMUSCULAR | Status: AC
  Filled 2018-04-23: qty 2

## 2018-04-23 MED ORDER — NALOXONE HCL 0.4 MG/ML IJ SOLN: 0.4000 mg | INTRAMUSCULAR | Status: DC | PRN

## 2018-04-23 MED ORDER — PHENYLEPHRINE 8 MG IN D5W 100 ML (0.08MG/ML) PREMIX OPTIME
INJECTION | INTRAVENOUS | Status: DC | PRN
  Administered 2018-04-23: 60 ug/min via INTRAVENOUS

## 2018-04-23 MED ORDER — DIPHENHYDRAMINE HCL 25 MG PO CAPS: 25.0000 mg | ORAL_CAPSULE | Freq: Four times a day (QID) | ORAL | Status: DC | PRN

## 2018-04-23 MED ORDER — SIMETHICONE 80 MG PO CHEW
80.0000 mg | CHEWABLE_TABLET | ORAL | Status: DC
  Administered 2018-04-24 – 2018-04-25 (×2): 80 mg via ORAL
  Filled 2018-04-23 (×2): qty 1

## 2018-04-23 MED ORDER — DIPHENHYDRAMINE HCL 25 MG PO CAPS
25.0000 mg | ORAL_CAPSULE | ORAL | Status: DC | PRN
  Filled 2018-04-23: qty 1

## 2018-04-23 MED ORDER — SODIUM CHLORIDE 0.9 % IR SOLN
Status: DC | PRN
  Administered 2018-04-23: 1

## 2018-04-23 MED ORDER — MEPERIDINE HCL 25 MG/ML IJ SOLN: 6.2500 mg | INTRAMUSCULAR | Status: DC | PRN

## 2018-04-23 MED ORDER — IBUPROFEN 600 MG PO TABS
600.0000 mg | ORAL_TABLET | Freq: Four times a day (QID) | ORAL | Status: DC
  Administered 2018-04-23 – 2018-04-25 (×8): 600 mg via ORAL
  Filled 2018-04-23 (×8): qty 1

## 2018-04-23 MED ORDER — COCONUT OIL OIL: 1.0000 "application " | TOPICAL_OIL | Status: DC | PRN

## 2018-04-23 MED ORDER — PHENYLEPHRINE 8 MG IN D5W 100 ML (0.08MG/ML) PREMIX OPTIME
INJECTION | INTRAVENOUS | Status: AC
  Filled 2018-04-23: qty 100

## 2018-04-23 MED ORDER — SODIUM CHLORIDE 0.9% FLUSH: 3.0000 mL | INTRAVENOUS | Status: DC | PRN

## 2018-04-23 MED ORDER — CEFAZOLIN SODIUM-DEXTROSE 2-4 GM/100ML-% IV SOLN
2.0000 g | INTRAVENOUS | Status: DC
  Filled 2018-04-23: qty 100

## 2018-04-23 MED ORDER — SIMETHICONE 80 MG PO CHEW: 80.0000 mg | CHEWABLE_TABLET | ORAL | Status: DC | PRN

## 2018-04-23 MED ORDER — SCOPOLAMINE 1 MG/3DAYS TD PT72
1.0000 | MEDICATED_PATCH | Freq: Once | TRANSDERMAL | Status: DC
  Administered 2018-04-23: 1.5 mg via TRANSDERMAL

## 2018-04-23 MED ORDER — SCOPOLAMINE 1 MG/3DAYS TD PT72
MEDICATED_PATCH | TRANSDERMAL | Status: AC
  Filled 2018-04-23: qty 1

## 2018-04-23 MED ORDER — TETANUS-DIPHTH-ACELL PERTUSSIS 5-2.5-18.5 LF-MCG/0.5 IM SUSP: 0.5000 mL | Freq: Once | INTRAMUSCULAR | Status: DC

## 2018-04-23 MED ORDER — NALBUPHINE HCL 10 MG/ML IJ SOLN: 5.0000 mg | INTRAMUSCULAR | Status: DC | PRN

## 2018-04-23 MED ORDER — NALOXONE HCL 4 MG/10ML IJ SOLN
1.0000 ug/kg/h | INTRAVENOUS | Status: DC | PRN
  Filled 2018-04-23: qty 5

## 2018-04-23 MED ORDER — FENTANYL CITRATE (PF) 100 MCG/2ML IJ SOLN
INTRAMUSCULAR | Status: AC
  Filled 2018-04-23: qty 2

## 2018-04-23 MED ORDER — BUPIVACAINE IN DEXTROSE 0.75-8.25 % IT SOLN
INTRATHECAL | Status: DC | PRN
  Administered 2018-04-23: 1.6 mL via INTRATHECAL

## 2018-04-23 MED ORDER — DEXAMETHASONE SODIUM PHOSPHATE 4 MG/ML IJ SOLN
INTRAMUSCULAR | Status: AC
  Filled 2018-04-23: qty 1

## 2018-04-23 MED ORDER — OXYTOCIN 40 UNITS IN LACTATED RINGERS INFUSION - SIMPLE MED: 2.5000 [IU]/h | INTRAVENOUS | Status: AC

## 2018-04-23 MED ORDER — PHENYLEPHRINE 40 MCG/ML (10ML) SYRINGE FOR IV PUSH (FOR BLOOD PRESSURE SUPPORT)
PREFILLED_SYRINGE | INTRAVENOUS | Status: AC
  Filled 2018-04-23: qty 10

## 2018-04-23 MED ORDER — WITCH HAZEL-GLYCERIN EX PADS: 1.0000 "application " | MEDICATED_PAD | CUTANEOUS | Status: DC | PRN

## 2018-04-23 MED ORDER — OXYTOCIN 10 UNIT/ML IJ SOLN
INTRAVENOUS | Status: DC | PRN
  Administered 2018-04-23: 40 [IU] via INTRAVENOUS

## 2018-04-23 MED ORDER — DEXAMETHASONE SODIUM PHOSPHATE 4 MG/ML IJ SOLN
INTRAMUSCULAR | Status: DC | PRN
  Administered 2018-04-23: 4 mg via INTRAVENOUS

## 2018-04-23 MED ORDER — ACETAMINOPHEN 325 MG PO TABS: 650.0000 mg | ORAL_TABLET | ORAL | Status: DC | PRN

## 2018-04-23 MED ORDER — MORPHINE SULFATE (PF) 0.5 MG/ML IJ SOLN
INTRAMUSCULAR | Status: DC | PRN
  Administered 2018-04-23: 150 ug via INTRATHECAL

## 2018-04-23 MED ORDER — PRENATAL MULTIVITAMIN CH
1.0000 | ORAL_TABLET | Freq: Every day | ORAL | Status: DC
  Administered 2018-04-24 – 2018-04-25 (×2): 1 via ORAL
  Filled 2018-04-23 (×2): qty 1

## 2018-04-23 MED ORDER — OXYTOCIN 10 UNIT/ML IJ SOLN
INTRAMUSCULAR | Status: AC
  Filled 2018-04-23: qty 4

## 2018-04-23 MED ORDER — LACTATED RINGERS IV SOLN
INTRAVENOUS | Status: DC
  Administered 2018-04-23 (×2): via INTRAVENOUS

## 2018-04-23 MED ORDER — FENTANYL CITRATE (PF) 100 MCG/2ML IJ SOLN: 25.0000 ug | INTRAMUSCULAR | Status: DC | PRN

## 2018-04-23 MED ORDER — MORPHINE SULFATE (PF) 0.5 MG/ML IJ SOLN
INTRAMUSCULAR | Status: AC
  Filled 2018-04-23: qty 10

## 2018-04-23 MED ORDER — OXYCODONE-ACETAMINOPHEN 5-325 MG PO TABS: 2.0000 | ORAL_TABLET | ORAL | Status: DC | PRN

## 2018-04-23 MED ORDER — METOCLOPRAMIDE HCL 5 MG/ML IJ SOLN
INTRAMUSCULAR | Status: DC | PRN
  Administered 2018-04-23 (×2): 5 mg via INTRAVENOUS

## 2018-04-23 MED ORDER — ACETAMINOPHEN 500 MG PO TABS
1000.0000 mg | ORAL_TABLET | Freq: Four times a day (QID) | ORAL | Status: AC
  Administered 2018-04-23 – 2018-04-24 (×3): 1000 mg via ORAL
  Filled 2018-04-23 (×3): qty 2

## 2018-04-23 MED ORDER — MENTHOL 3 MG MT LOZG: 1.0000 | LOZENGE | OROMUCOSAL | Status: DC | PRN

## 2018-04-23 MED ORDER — LACTATED RINGERS IV SOLN
INTRAVENOUS | Status: DC
  Administered 2018-04-23: 20:00:00 via INTRAVENOUS

## 2018-04-23 MED ORDER — LACTATED RINGERS IV SOLN
INTRAVENOUS | Status: DC | PRN
  Administered 2018-04-23: 13:00:00 via INTRAVENOUS

## 2018-04-23 SURGICAL SUPPLY — 36 items
APL SKNCLS STERI-STRIP NONHPOA (GAUZE/BANDAGES/DRESSINGS) ×1
BENZOIN TINCTURE PRP APPL 2/3 (GAUZE/BANDAGES/DRESSINGS) ×2 IMPLANT
CHLORAPREP W/TINT 26ML (MISCELLANEOUS) ×3 IMPLANT
CLAMP CORD UMBIL (MISCELLANEOUS) IMPLANT
CLOSURE STERI STRIP 1/2 X4 (GAUZE/BANDAGES/DRESSINGS) ×2 IMPLANT
CLOTH BEACON ORANGE TIMEOUT ST (SAFETY) ×3 IMPLANT
DRSG OPSITE POSTOP 4X10 (GAUZE/BANDAGES/DRESSINGS) ×3 IMPLANT
ELECT REM PT RETURN 9FT ADLT (ELECTROSURGICAL) ×3
ELECTRODE REM PT RTRN 9FT ADLT (ELECTROSURGICAL) ×1 IMPLANT
EXTRACTOR VACUUM M CUP 4 TUBE (SUCTIONS) IMPLANT
EXTRACTOR VACUUM M CUP 4' TUBE (SUCTIONS)
GLOVE BIOGEL PI IND STRL 6.5 (GLOVE) ×1 IMPLANT
GLOVE BIOGEL PI IND STRL 7.0 (GLOVE) ×1 IMPLANT
GLOVE BIOGEL PI INDICATOR 6.5 (GLOVE) ×2
GLOVE BIOGEL PI INDICATOR 7.0 (GLOVE) ×2
GLOVE ECLIPSE 6.5 STRL STRAW (GLOVE) ×3 IMPLANT
GOWN STRL REUS W/TWL LRG LVL3 (GOWN DISPOSABLE) ×6 IMPLANT
KIT ABG SYR 3ML LUER SLIP (SYRINGE) IMPLANT
NDL HYPO 25X5/8 SAFETYGLIDE (NEEDLE) IMPLANT
NEEDLE HYPO 25X5/8 SAFETYGLIDE (NEEDLE) IMPLANT
NS IRRIG 1000ML POUR BTL (IV SOLUTION) ×3 IMPLANT
PACK C SECTION WH (CUSTOM PROCEDURE TRAY) ×3 IMPLANT
PAD ABD 7.5X8 STRL (GAUZE/BANDAGES/DRESSINGS) IMPLANT
PAD OB MATERNITY 4.3X12.25 (PERSONAL CARE ITEMS) ×3 IMPLANT
PENCIL SMOKE EVAC W/HOLSTER (ELECTROSURGICAL) ×3 IMPLANT
RTRCTR C-SECT PINK 25CM LRG (MISCELLANEOUS) ×3 IMPLANT
SPONGE LAP 18X18 RF (DISPOSABLE) ×9 IMPLANT
SUT MON AB 2-0 CT1 27 (SUTURE) ×3 IMPLANT
SUT MON AB 4-0 PS1 27 (SUTURE) IMPLANT
SUT PDS AB 0 CTX 60 (SUTURE) IMPLANT
SUT PLAIN 2 0 XLH (SUTURE) IMPLANT
SUT VIC AB 0 CTX 36 (SUTURE) ×12
SUT VIC AB 0 CTX36XBRD ANBCTRL (SUTURE) ×4 IMPLANT
SUT VIC AB 4-0 KS 27 (SUTURE) ×2 IMPLANT
TOWEL OR 17X24 6PK STRL BLUE (TOWEL DISPOSABLE) ×3 IMPLANT
TRAY FOLEY W/BAG SLVR 14FR LF (SET/KITS/TRAYS/PACK) ×3 IMPLANT

## 2018-04-23 NOTE — Brief Op Note (Signed)
04/23/2018  1:23 PM  PATIENT:  Sarah Andersen  27 y.o. female  PRE-OPERATIVE DIAGNOSIS:  BREECH, velamentous insertion EDD: 04/20/18 NKDA  POST-OPERATIVE DIAGNOSIS:  BREECH, velamentous insertion EDD: 04/20/18  PROCEDURE:  Low transverse cesarean section SURGEON:  Surgeon(s) and Role:    Philip Aspen, DO - Primary    * Carrington Clamp, MD - Assisting  ANESTHESIA:   spinal  EBL:  142 mL    FINDINGS: female infant, breech presentation  APGARS 8/9, normal bilateral tubes and ovaries  LOCAL MEDICATIONS USED:  NONE  SPECIMEN:  Source of Specimen:  cord blood  DISPOSITION OF SPECIMEN:  N/A  COUNTS:  YES  PLAN OF CARE: Admit to inpatient   PATIENT DISPOSITION:  PACU - hemodynamically stable.   Delay start of Pharmacological VTE agent (>24hrs) due to surgical blood loss or risk of bleeding: not applicable

## 2018-04-23 NOTE — H&P (Signed)
27 y.o. [redacted]w[redacted]d  G2P0101 comes for scheduled cesarean section for breech.  Otherwise has good fetal movement and no bleeding.  Past Medical History:  Diagnosis Date  . Hypertension   . Medical history non-contributory   . Velamentous insertion of umbilical cord     Past Surgical History:  Procedure Laterality Date  . NO PAST SURGERIES      OB History  Gravida Para Term Preterm AB Living  2 1 0 1 0 1  SAB TAB Ectopic Multiple Live Births  0 0 0 0 1    # Outcome Date GA Lbr Len/2nd Weight Sex Delivery Anes PTL Lv  2 Current           1 Preterm 01/26/13 [redacted]w[redacted]d 10:01 / 00:41 2485 g M Vag-Spont EPI  LIV     Birth Comments: right lower extremity anomaly noted    Social History   Socioeconomic History  . Marital status: Single    Spouse name: Not on file  . Number of children: Not on file  . Years of education: Not on file  . Highest education level: Not on file  Occupational History  . Not on file  Social Needs  . Financial resource strain: Not hard at all  . Food insecurity:    Worry: Never true    Inability: Never true  . Transportation needs:    Medical: No    Non-medical: Not on file  Tobacco Use  . Smoking status: Never Smoker  . Smokeless tobacco: Never Used  Substance and Sexual Activity  . Alcohol use: No  . Drug use: No  . Sexual activity: Yes    Birth control/protection: None  Lifestyle  . Physical activity:    Days per week: Not on file    Minutes per session: Not on file  . Stress: Not at all  Relationships  . Social connections:    Talks on phone: Not on file    Gets together: Not on file    Attends religious service: Not on file    Active member of club or organization: Not on file    Attends meetings of clubs or organizations: Not on file    Relationship status: Not on file  . Intimate partner violence:    Fear of current or ex partner: No    Emotionally abused: No    Physically abused: No    Forced sexual activity: No  Other Topics Concern  .  Not on file  Social History Narrative  . Not on file   Patient has no known allergies.    Prenatal Transfer Tool  Maternal Diabetes: No Genetic Screening: Normal Maternal Ultrasounds/Referrals: Abnormal:  Findings:   Other: velamentous cord insertion Fetal Ultrasounds or other Referrals:  MFM f/u scan Maternal Substance Abuse:  No Significant Maternal Medications:  None Significant Maternal Lab Results: Lab values include: Group B Strep negative  Other PNC: Growth scan 03/24/2018: EFW 20%, stable from prior scan    Vitals:   04/23/18 1100 04/23/18 1111  BP:  119/78  Pulse:  71  Resp:  18  Temp:  98.3 F (36.8 C)  TempSrc:  Oral  Weight: 67.3 kg   Height: 5\' 6"  (1.676 m)     Lungs/Cor:  NAD Abdomen:  soft, gravid Ex:  no cords, erythema FHT 135  [place]: confirmed breech   A/P   Admit for schedule c/s for breech  GBS neg  Ancef 2g pre-op prophylaxis  Other routine care  [place], Luther Parody

## 2018-04-23 NOTE — Transfer of Care (Signed)
Immediate Anesthesia Transfer of Care Note  Patient: Sarah Andersen  Procedure(s) Performed: CESAREAN SECTION (N/A Abdomen)  Patient Location: PACU  Anesthesia Type:Spinal  Level of Consciousness: awake, alert  and oriented  Airway & Oxygen Therapy: Patient Spontanous Breathing  Post-op Assessment: Report given to RN and Post -op Vital signs reviewed and stable  Post vital signs: Reviewed and stable  Last Vitals:  Vitals Value Taken Time  BP 102/73 04/23/2018  1:32 PM  Temp    Pulse    Resp    SpO2    Vitals shown include unvalidated device data.  Last Pain:  Vitals:   04/23/18 1111  TempSrc: Oral      Patients Stated Pain Goal: 4 (04/23/18 1100)  Complications: No apparent anesthesia complications

## 2018-04-23 NOTE — Anesthesia Preprocedure Evaluation (Signed)
Anesthesia Evaluation  Patient identified by MRN, date of birth, ID band Patient awake    Reviewed: Allergy & Precautions, NPO status , Patient's Chart, lab work & pertinent test results  Airway Mallampati: II  TM Distance: >3 FB Neck ROM: Full    Dental   Pulmonary neg pulmonary ROS,    breath sounds clear to auscultation       Cardiovascular negative cardio ROS   Rhythm:Regular Rate:Normal     Neuro/Psych negative neurological ROS     GI/Hepatic negative GI ROS, Neg liver ROS,   Endo/Other  negative endocrine ROS  Renal/GU negative Renal ROS     Musculoskeletal   Abdominal   Peds  Hematology negative hematology ROS (+)   Anesthesia Other Findings   Reproductive/Obstetrics (+) Pregnancy                             Lab Results  Component Value Date   WBC 7.8 04/22/2018   HGB 12.6 04/22/2018   HCT 37.1 04/22/2018   MCV 89.8 04/22/2018   PLT 187 04/22/2018   Lab Results  Component Value Date   CREATININE 0.64 01/25/2013   BUN 4 (L) 01/25/2013   NA 137 01/25/2013   K 3.3 (L) 01/25/2013   CL 104 01/25/2013   CO2 23 01/25/2013    Anesthesia Physical Anesthesia Plan  ASA: II  Anesthesia Plan: Spinal   Post-op Pain Management:    Induction:   PONV Risk Score and Plan: 2 and Dexamethasone, Ondansetron and Treatment may vary due to age or medical condition  Airway Management Planned: Natural Airway  Additional Equipment:   Intra-op Plan:   Post-operative Plan:   Informed Consent: I have reviewed the patients History and Physical, chart, labs and discussed the procedure including the risks, benefits and alternatives for the proposed anesthesia with the patient or authorized representative who has indicated his/her understanding and acceptance.     Plan Discussed with: CRNA  Anesthesia Plan Comments:         Anesthesia Quick Evaluation

## 2018-04-23 NOTE — Anesthesia Procedure Notes (Signed)
Spinal  Start time: 04/23/2018 12:22 PM End time: 04/23/2018 12:26 PM Staffing Anesthesiologist: Marcene Duos, MD Performed: anesthesiologist  Preanesthetic Checklist Completed: patient identified, site marked, surgical consent, pre-op evaluation, timeout performed, IV checked, risks and benefits discussed and monitors and equipment checked Spinal Block Patient position: sitting Prep: site prepped and draped and DuraPrep Patient monitoring: blood pressure, continuous pulse ox and heart rate Approach: midline Location: L4-5 Injection technique: single-shot Needle Needle type: Pencan  Needle gauge: 24 G Needle length: 9 cm Assessment Sensory level: T6

## 2018-04-23 NOTE — Anesthesia Postprocedure Evaluation (Signed)
Anesthesia Post Note  Patient: Sarah Andersen  Procedure(s) Performed: CESAREAN SECTION (N/A Abdomen)     Patient location during evaluation: PACU Anesthesia Type: Spinal Level of consciousness: awake and alert Pain management: pain level controlled Vital Signs Assessment: post-procedure vital signs reviewed and stable Respiratory status: spontaneous breathing and respiratory function stable Cardiovascular status: blood pressure returned to baseline and stable Postop Assessment: spinal receding Anesthetic complications: no    Last Vitals:  Vitals:   04/23/18 1515 04/23/18 1537  BP: 112/87 (!) 123/92  Pulse: 66   Resp: 18 16  Temp: 36.7 C 36.7 C  SpO2: 99% 100%    Last Pain:  Vitals:   04/23/18 1537  TempSrc: Oral  PainSc:    Pain Goal: Patients Stated Pain Goal: 0 (04/23/18 1334)               Kennieth Rad

## 2018-04-24 ENCOUNTER — Encounter (HOSPITAL_COMMUNITY): Payer: Self-pay | Admitting: *Deleted

## 2018-04-24 LAB — CBC
HCT: 31 % — ABNORMAL LOW (ref 36.0–46.0)
Hemoglobin: 10.9 g/dL — ABNORMAL LOW (ref 12.0–15.0)
MCH: 31.5 pg (ref 26.0–34.0)
MCHC: 35.2 g/dL (ref 30.0–36.0)
MCV: 89.6 fL (ref 80.0–100.0)
Platelets: 161 10*3/uL (ref 150–400)
RBC: 3.46 MIL/uL — ABNORMAL LOW (ref 3.87–5.11)
RDW: 13.9 % (ref 11.5–15.5)
WBC: 13 10*3/uL — AB (ref 4.0–10.5)
nRBC: 0 % (ref 0.0–0.2)

## 2018-04-24 NOTE — Progress Notes (Signed)
  Patient is eating, ambulating, voiding.  Pain control is good.  Vitals:   04/24/18 0017 04/24/18 0230 04/24/18 0527 04/24/18 0618  BP:   107/81   Pulse:   (!) 57   Resp: 16     Temp: 99.1 F (37.3 C)  98.1 F (36.7 C)   TempSrc: Oral  Oral   SpO2: 99% 97%  98%  Weight:      Height:        lungs:   clear to auscultation cor:    RRR Abdomen:  soft, appropriate tenderness, incisions intact and without erythema or exudate ex:    no cords   Lab Results  Component Value Date   WBC 13.0 (H) 04/24/2018   HGB 10.9 (L) 04/24/2018   HCT 31.0 (L) 04/24/2018   MCV 89.6 04/24/2018   PLT 161 04/24/2018    --/--/O POS (10/17 1023)/RI  A/P    Post operative day 1.  Routine post op and postpartum care.  Expect d/c routine.  Percocet for pain control.

## 2018-04-24 NOTE — Addendum Note (Signed)
Addendum  created 04/24/18 0759 by Trellis Paganini, CRNA   Sign clinical note

## 2018-04-24 NOTE — Anesthesia Postprocedure Evaluation (Signed)
Anesthesia Post Note  Patient: Sarah Andersen  Procedure(s) Performed: CESAREAN SECTION (N/A Abdomen)     Patient location during evaluation: Mother Baby Anesthesia Type: Spinal Level of consciousness: oriented and awake and alert Pain management: pain level controlled Vital Signs Assessment: post-procedure vital signs reviewed and stable Respiratory status: spontaneous breathing, respiratory function stable and patient connected to nasal cannula oxygen Cardiovascular status: blood pressure returned to baseline and stable Postop Assessment: no headache, no backache and no apparent nausea or vomiting Anesthetic complications: no    Last Vitals:  Vitals:   04/24/18 0527 04/24/18 0618  BP: 107/81   Pulse: (!) 57   Resp:    Temp: 36.7 C   SpO2:  98%    Last Pain:  Vitals:   04/24/18 0700  TempSrc:   PainSc: 0-No pain   Pain Goal: Patients Stated Pain Goal: 0 (04/23/18 1334)               Emmaline Kluver N

## 2018-04-25 MED ORDER — OXYCODONE-ACETAMINOPHEN 5-325 MG PO TABS: 1.0000 | ORAL_TABLET | ORAL | 0 refills | Status: AC | PRN

## 2018-04-25 NOTE — Discharge Summary (Signed)
Obstetric Discharge Summary Reason for Admission: cesarean section Prenatal Procedures: none Intrapartum Procedures: cesarean: low cervical, transverse Postpartum Procedures: none Complications-Operative and Postpartum: none Hemoglobin  Date Value Ref Range Status  04/24/2018 10.9 (L) 12.0 - 15.0 g/dL Final   HCT  Date Value Ref Range Status  04/24/2018 31.0 (L) 36.0 - 46.0 % Final    Discharge Diagnoses: Term Pregnancy-delivered  Discharge Information: Date: 04/25/2018 Activity: pelvic rest Diet: routine Medications: Percocet Condition: stable Instructions: refer to practice specific booklet Discharge to: home Follow-up Information    Philip Aspen, DO Follow up in 4 week(s).   Specialty:  Obstetrics and Gynecology Contact information: [address] [place] [address] [phone]           Newborn Data: Live born female  Birth Weight: 6 lb 8.4 oz (2960 g) APGAR: 8, 9  Newborn Delivery   Birth date/time:  04/23/2018 12:48:00 Delivery type:  C-Section, Low Transverse Trial of labor:  No C-section categorization:  Primary     Home with mother.  Johnthomas Lader A 04/25/2018, 9:55 AM

## 2018-04-25 NOTE — Lactation Note (Signed)
This note was copied from a baby's chart. Lactation Consultation Note Baby 40 hrs old. Mom's 1st child BF, mom didn't BF her 1st child. Mom holding baby in cradle position wrapped in blankets. Mom states latching hurts at first then gets better.  Mom has tight breast w/semi compressible areolas. Discussed obtaining deep latch. Everted nipples intact. encouraged to BF STS or w/no blankets. Discussed positioning, support, breast massage, STS, I&O, cluster feeding, supply and demand.  Shells given to mom to wear to soften areola tissue for deeper latch. Hand pump given to pre-pump if needed. Mom encouraged to feed baby 8-12 times/24 hours and with feeding cues.  Encouraged to call for assistance if needed.  [facility] brochure given w/resources, support groups and LC services  Patient Name: Sarah Andersen Today's Date: 04/25/2018 Reason for consult: Initial assessment;1st time breastfeeding   Maternal Data Has patient been taught Hand Expression?: Yes Does the patient have breastfeeding experience prior to this delivery?: No  Feeding    LATCH Score       Type of Nipple: Everted at rest and after stimulation  Comfort (Breast/Nipple): Soft / non-tender        Interventions Interventions: Breast feeding basics reviewed;Support pillows;Position options;Breast massage;Hand express;Pre-pump if needed;Hand pump;Breast compression;Adjust position  Lactation Tools Discussed/Used Tools: Shells;Pump Shell Type: Inverted Breast pump type: Manual Pump Review: Milk Storage Initiated by:: L.Adreanne Yono RN IBCLC Date initiated:: 04/25/18   Consult Status Consult Status: Follow-up Date: 04/26/18 Follow-up type: In-patient    Charyl Dancer 04/25/2018, 4:52 AM

## 2018-04-25 NOTE — Progress Notes (Signed)
  Patient is eating, ambulating, voiding.  Pain control is good.  Vitals:   04/24/18 0900 04/24/18 1300 04/24/18 2012 04/25/18 0619  BP: 114/86 102/72 114/67 112/87  Pulse:  66 82 62  Resp: 18 20 18 18   Temp: 97.7 F (36.5 C) 97.6 F (36.4 C) 98.1 F (36.7 C) 98.1 F (36.7 C)  TempSrc:   Oral   SpO2: 91%   100%  Weight:      Height:        lungs:   clear to auscultation cor:    RRR Abdomen:  soft, appropriate tenderness, incisions intact and without erythema or exudate ex:    no cords   Lab Results  Component Value Date   WBC 13.0 (H) 04/24/2018   HGB 10.9 (L) 04/24/2018   HCT 31.0 (L) 04/24/2018   MCV 89.6 04/24/2018   PLT 161 04/24/2018    --/--/O POS (10/17 1023)/RI  A/P    Post operative day 2.  Routine post op and postpartum care.  Expect d/c today.  Percocet for pain control. Circ to be done outpatient.

## 2018-05-17 NOTE — Op Note (Signed)
NAMELEANDREA, ACKLEY MEDICAL RECORD [id number] ACCOUNT [id number] DATE OF BIRTH:12-29-1990 FACILITY: [facility] LOCATION: [id number] PHYSICIAN:Reha Martinovich M. Claiborne Billings, DO  OPERATIVE REPORT  DATE OF PROCEDURE:  04/23/2018  PREOPERATIVE DIAGNOSIS:  Breech presentation, velamentous cord insertion.  POSTOPERATIVE DIAGNOSIS:  Breech presentation, velamentous cord insertion.  PROCEDURE:  Low transverse cesarean section.  SURGEON:  Philip Aspen, DO  ASSISTANT:  Carrington Clamp, MD  ANESTHESIA:  Spinal.  ESTIMATED BLOOD LOSS:  142 mL.  FINDINGS:  Female infant, breech presentation, Apgars 8 and 9.  Normal tubes and ovaries bilaterally.  SPECIMENS:  Cord blood.  COMPLICATIONS:  None.  CONDITION:  Stable to PACU.  DESCRIPTION OF PROCEDURE:  The patient was taken to the operating room where spinal anesthesia was administered and found to be adequate.  She was prepped and draped in the normal sterile fashion in dorsal supine position with a leftward tilt.  A  Pfannenstiel skin incision was made with a scalpel and carried down to underlying layer of fascia with Bovie cautery.  The fascia was incised in the midline with the scalpel and extended laterally.  Kocher clamps were placed at the superior aspect of the  fascial incision.  Rectus muscles were dissected off bluntly and sharply.  Kocher clamps were then placed at the inferior aspect of the fascial incision.  Rectus muscles were again dissected off bluntly and sharply.  A hemostat was used to separate  rectus muscle superiorly with good visualization of the peritoneum.  It was entered bluntly.  The peritoneal incision was extended by manual lateral traction.  The abdomen and pelvis was surveyed manually and findings noted above.  An Information systems manager was placed, and the vesicouterine peritoneum was identified and entered sharply with Metzenbaum scissors, extended laterally, and developed digitally.  A scalpel was used to make a low  transverse cesarean incision, and the amniotic sac was  entered bluntly with Allis clamps.  The uterine incision was extended by cephalic and caudal traction, and the left and right foot were palpated, located and delivered, followed by the infant's legs and and hips.  The infant was rotated from side to side  with sweeping of both arms down and across the chest, keeping the head in a flexed position, and with fundal pressure, the infant's head was delivered without difficulty.  After delayed cord clamping, the cord was clamped and cut.  Bulb suction was performed and  was handed off to awaiting neonatology.  Cord blood was then collected, and external massage of the uterus was performed with good tone noted.  The placenta was delivered with gentle traction on the umbilical cord, and the uterus was cleared of all clot  and debris.  The uterine incision was reapproximated and closed with Vicryl in a running locked fashion, followed by a second layer of vertical imbrication.  Excellent hemostasis was noted.  Other findings noted above.  No significant bleeding noted.   All surfaces were hemostatic.  Peritoneum was reapproximated and closed with 3-0 Monocryl in a running fashion.  Fascia was then reapproximated and closed with 0 Vicryl in a running fashion.  Subcutaneous tissue was irrigated, dried and minimal use of  Bovie cautery was needed to control small bleeders.  Skin was reapproximated and closed subcuticularly with Vicryl on a Keith needle.  The patient tolerated the procedure well.  Sponge, lap and needle counts were correct x2.  The patient was taken to  recovery in stable condition.     LN/NUANCE  D:05/15/2018 T:05/15/2018 JOB:[id number]

## 2019-08-01 IMAGING — US US MFM OB DETAIL+14 WK
1 series · 13 of 28 positions shown · non-contrast
Comparison: none

[Series 1: us mfm ob detail+14 wk · 13 of 82 slices shown]
[im 4/82]
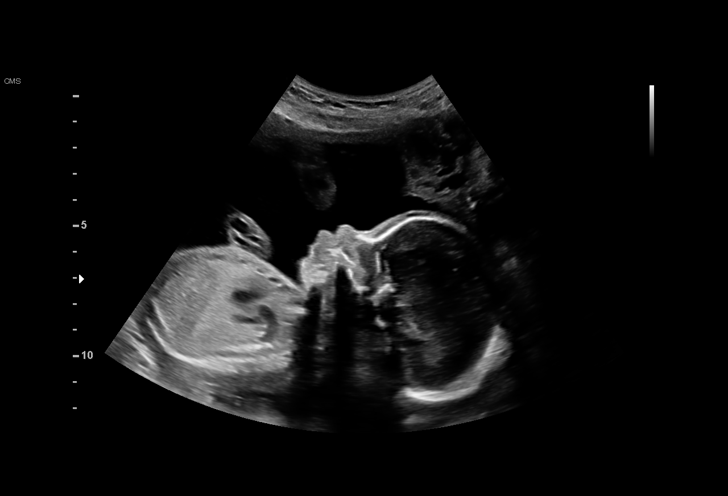
[im 10/82]
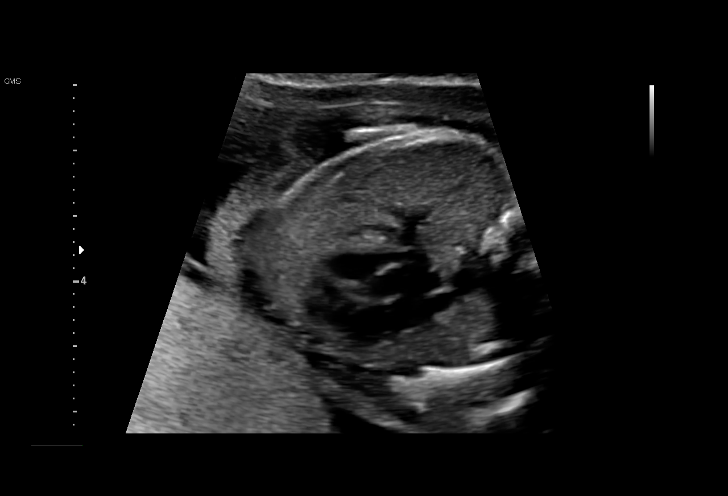
[im 16/82]
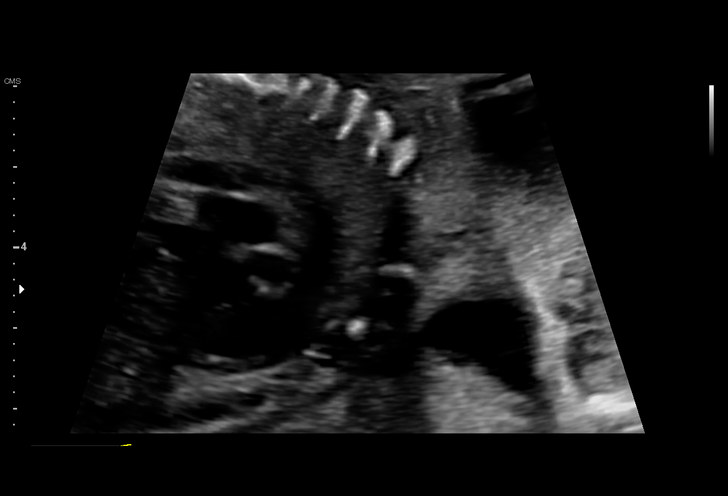
[im 22/82]
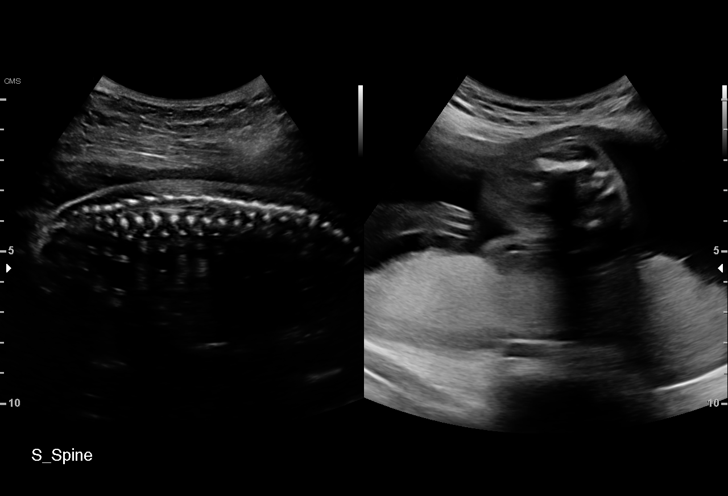
[im 28/82]
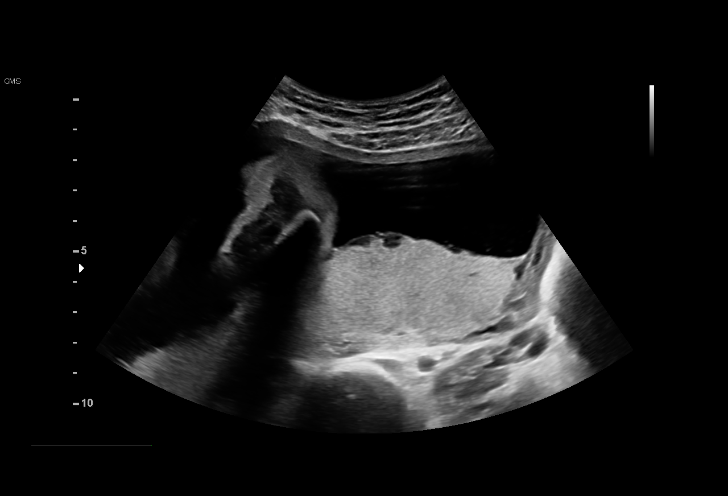
[im 34/82]
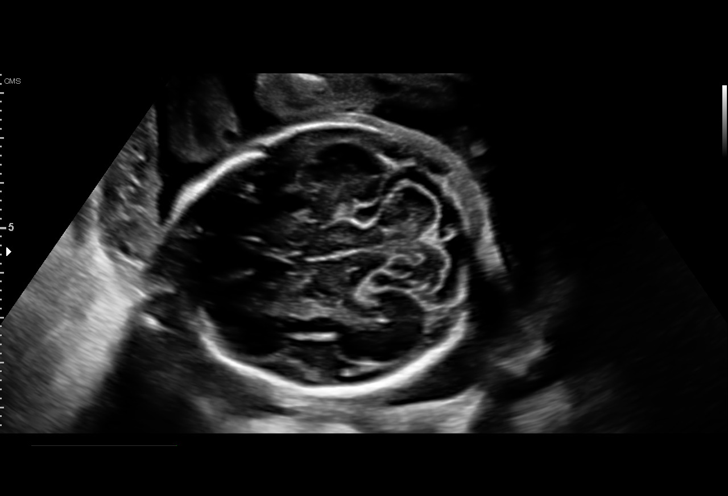
[im 43/82]
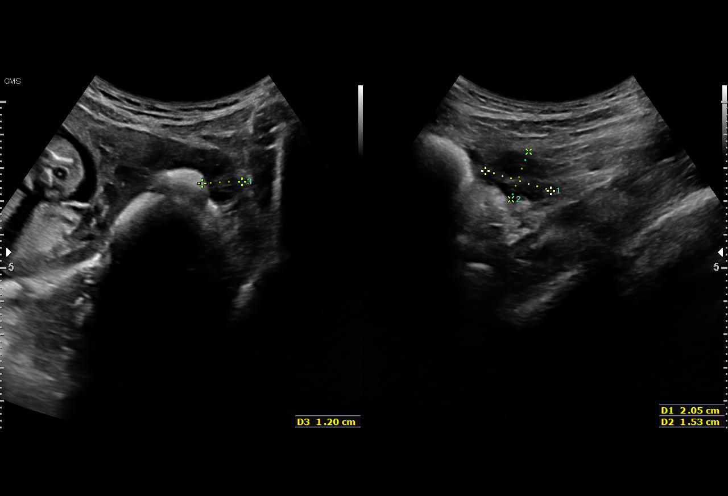
[im 49/82]
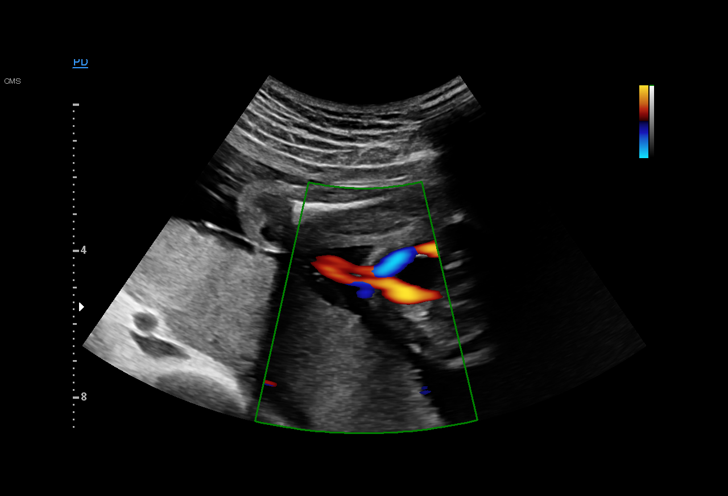
[im 55/82]
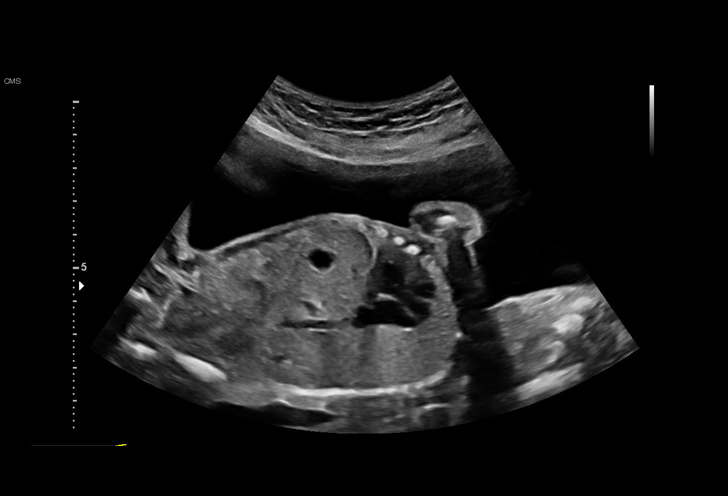
[im 61/82]
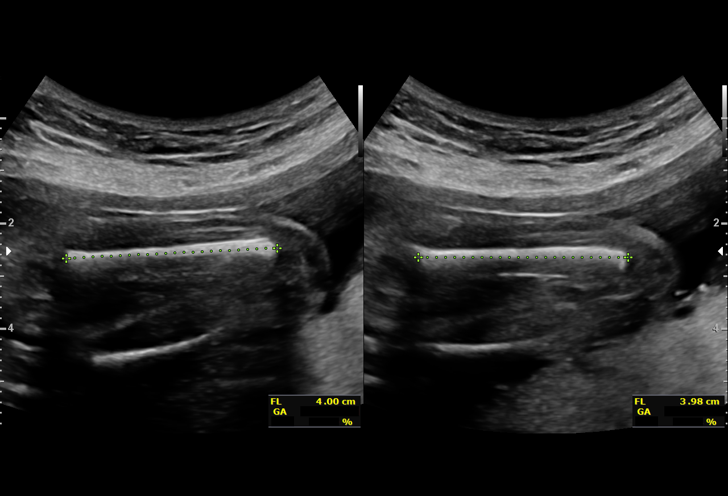
[im 67/82]
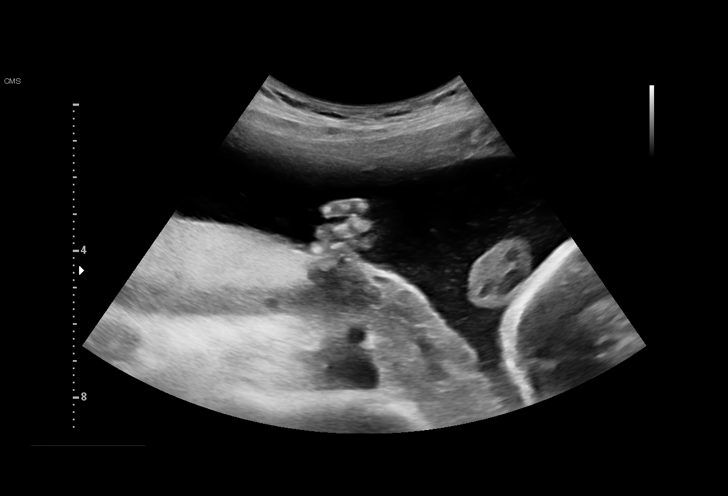
[im 73/82]
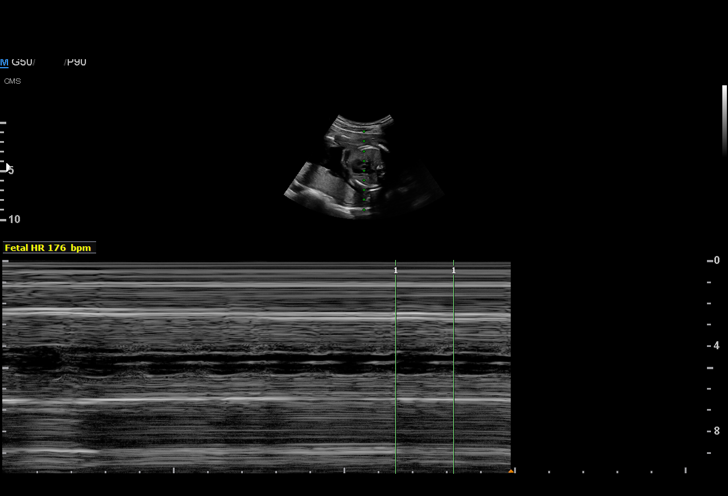
[im 79/82]
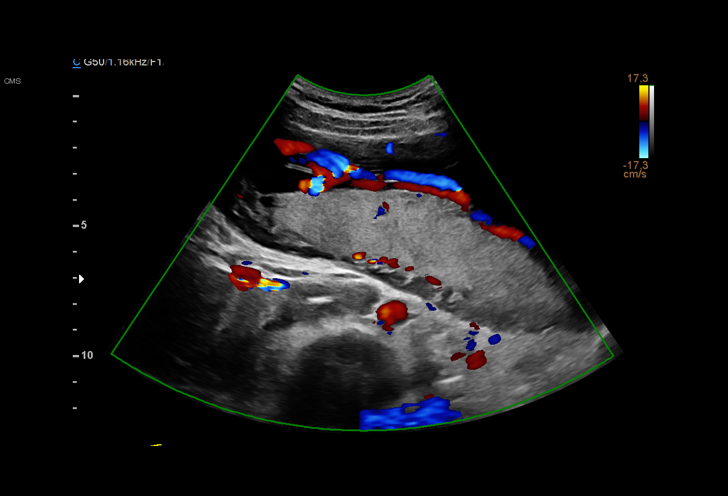

[13 of 28 positions shown; findings below may reference images not displayed]

TIGER DO

1  NOMASIBULELE MOATSHE          [id number]       [id number]     [id number]
Indications

23 weeks gestation of pregnancy
Encounter for antenatal screening for
malformations
Velamentous insertion of umbilical cord
Previous child with congenital anomaly,
antepartum (absent leg)
Poor obstetric history: Previous preeclampsia
Fetal Evaluation

Num Of Fetuses:     1
Fetal Heart         176
Rate(bpm):
Cardiac Activity:   Observed
Presentation:       Cephalic
Placenta:           Posterior, above cervical os

Amniotic Fluid
AFI FV:      Subjectively within normal limits

Largest Pocket(cm)
5.95
Biometry

BPD:      59.3  mm     G. Age:  24w 2d         74  %    CI:        75.94   %    70 - 86
FL/HC:      18.5   %    19.2 -
HC:      215.7  mm     G. Age:  23w 4d         42  %    HC/AC:      1.16        1.05 -
AC:      186.2  mm     G. Age:  23w 3d         41  %    FL/BPD:     67.3   %    71 - 87
FL:       39.9  mm     G. Age:  22w 6d         22  %    FL/AC:      21.4   %    20 - 24
HUM:      38.3  mm     G. Age:  23w 4d         45  %
CER:      27.6  mm     G. Age:  24w 6d         79  %
NFT:       4.4  mm
CM:        5.8  mm
Est. FW:     578  gm      1 lb 4 oz     50  %
Gestational Age

U/S Today:     23w 4d                                        EDD:   04/19/18
Best:          23w 3d     Det. By:  Early Ultrasound         EDD:   04/20/18
(10/07/17)
Anatomy

Cranium:               Appears normal         Aortic Arch:            Appears normal
Cavum:                 Appears normal         Ductal Arch:            Appears normal
Ventricles:            Appears normal         Diaphragm:              Appears normal
Choroid Plexus:        Appears normal         Stomach:                Appears normal, left
sided
Cerebellum:            Appears normal         Abdomen:                Appears normal
Posterior Fossa:       Appears normal         Abdominal Wall:         Appears nml (cord
insert, abd wall)
Nuchal Fold:           Appears normal         Cord Vessels:           Appears normal (3
vessel cord)
Face:                  Appears normal         Kidneys:                Appear normal
(orbits and profile)
Lips:                  Appears normal         Bladder:                Appears normal
Thoracic:              Appears normal         Spine:                  Appears normal
Heart:                 Appears normal         Upper Extremities:      Appears normal
(4CH, axis, and
situs)
RVOT:                  Appears normal         Lower Extremities:      Appears normal
LVOT:                  Appears normal

Other:  Parents do not wish to know sex of fetus. Heels and 5th digit
visualized. Nasal bone visualized. Open hands visualized.
Cervix Uterus Adnexa

Cervix
Length:            3.6  cm.
Normal appearance by transabdominal scan.

Uterus
No abnormality visualized.

Left Ovary
Within normal limits.

Right Ovary
Within normal limits.

Cul De Sac:   No free fluid seen.

Adnexa:       No abnormality visualized.
Impression

Singleton intrauterine pregnancy at 23+3 weeks with
suspected velamentous insertion here for evaluation
Review of the anatomy shows no sonographic markers for
aneuploidy or structural anomalies
all relevant fetal anatomy has been visualized
A small velamentous insertion is noted at the superior edge
of the placenta in the uterine fundus
Amniotic fluid volume is normal
Estimated fetal weight shows growth in the 50th percentile
Recommendations

Given the fundal location of the insertion, risk to the fetus is
minimal.
Recommend repeat evaluation for growth at 32 weeks
